# Patient Record
Sex: Female | Born: 1945
Health system: Southern US, Community
[De-identification: ages and names within clinical notes are randomized; demographics above are authoritative.]

## PROBLEM LIST (undated history)

## (undated) DIAGNOSIS — E785 Hyperlipidemia, unspecified: Secondary | ICD-10-CM

## (undated) DIAGNOSIS — I700 Atherosclerosis of aorta: Secondary | ICD-10-CM

## (undated) DIAGNOSIS — I10 Essential (primary) hypertension: Secondary | ICD-10-CM

## (undated) DIAGNOSIS — M5432 Sciatica, left side: Secondary | ICD-10-CM

## (undated) DIAGNOSIS — M1990 Unspecified osteoarthritis, unspecified site: Secondary | ICD-10-CM

## (undated) DIAGNOSIS — R413 Other amnesia: Secondary | ICD-10-CM

## (undated) DIAGNOSIS — M109 Gout, unspecified: Secondary | ICD-10-CM

## (undated) DIAGNOSIS — L409 Psoriasis, unspecified: Secondary | ICD-10-CM

## (undated) DIAGNOSIS — N183 Chronic kidney disease, stage 3 unspecified: Secondary | ICD-10-CM

## (undated) HISTORY — DX: Chronic kidney disease, stage 3 unspecified: N18.30

## (undated) HISTORY — DX: Hyperlipidemia, unspecified: E78.5

## (undated) HISTORY — DX: Other amnesia: R41.3

## (undated) HISTORY — DX: Sciatica, left side: M54.32

## (undated) HISTORY — DX: Gout, unspecified: M10.9

## (undated) HISTORY — DX: Unspecified osteoarthritis, unspecified site: M19.90

## (undated) HISTORY — DX: Psoriasis, unspecified: L40.9

## (undated) HISTORY — PX: APPENDECTOMY: SHX54

## (undated) HISTORY — DX: Essential (primary) hypertension: I10

## (undated) HISTORY — DX: Atherosclerosis of aorta: I70.0

---

## 2015-04-09 ENCOUNTER — Other Ambulatory Visit: Payer: Self-pay

## 2015-04-09 DIAGNOSIS — Z1231 Encounter for screening mammogram for malignant neoplasm of breast: Secondary | ICD-10-CM

## 2015-04-16 DIAGNOSIS — Z79899 Other long term (current) drug therapy: Secondary | ICD-10-CM | POA: Diagnosis not present

## 2015-04-16 DIAGNOSIS — E559 Vitamin D deficiency, unspecified: Secondary | ICD-10-CM | POA: Diagnosis not present

## 2015-04-16 DIAGNOSIS — M8580 Other specified disorders of bone density and structure, unspecified site: Secondary | ICD-10-CM | POA: Diagnosis not present

## 2015-04-16 DIAGNOSIS — Z7982 Long term (current) use of aspirin: Secondary | ICD-10-CM | POA: Diagnosis not present

## 2015-04-16 DIAGNOSIS — I10 Essential (primary) hypertension: Secondary | ICD-10-CM | POA: Diagnosis not present

## 2015-04-18 ENCOUNTER — Ambulatory Visit: Payer: Self-pay

## 2015-04-30 DIAGNOSIS — Z1211 Encounter for screening for malignant neoplasm of colon: Secondary | ICD-10-CM | POA: Diagnosis not present

## 2015-04-30 DIAGNOSIS — Z1212 Encounter for screening for malignant neoplasm of rectum: Secondary | ICD-10-CM | POA: Diagnosis not present

## 2015-05-01 DIAGNOSIS — H20023 Recurrent acute iridocyclitis, bilateral: Secondary | ICD-10-CM | POA: Diagnosis not present

## 2015-05-01 DIAGNOSIS — H40052 Ocular hypertension, left eye: Secondary | ICD-10-CM | POA: Diagnosis not present

## 2015-05-10 DIAGNOSIS — E785 Hyperlipidemia, unspecified: Secondary | ICD-10-CM | POA: Diagnosis not present

## 2015-05-10 DIAGNOSIS — I10 Essential (primary) hypertension: Secondary | ICD-10-CM | POA: Diagnosis not present

## 2015-05-22 DIAGNOSIS — I7090 Unspecified atherosclerosis: Secondary | ICD-10-CM | POA: Diagnosis not present

## 2015-05-22 DIAGNOSIS — I10 Essential (primary) hypertension: Secondary | ICD-10-CM | POA: Diagnosis not present

## 2015-05-22 DIAGNOSIS — E559 Vitamin D deficiency, unspecified: Secondary | ICD-10-CM | POA: Diagnosis not present

## 2015-06-04 DIAGNOSIS — H20023 Recurrent acute iridocyclitis, bilateral: Secondary | ICD-10-CM | POA: Diagnosis not present

## 2015-06-04 DIAGNOSIS — H40052 Ocular hypertension, left eye: Secondary | ICD-10-CM | POA: Diagnosis not present

## 2015-06-25 ENCOUNTER — Ambulatory Visit: Payer: Self-pay

## 2015-06-25 DIAGNOSIS — I10 Essential (primary) hypertension: Secondary | ICD-10-CM | POA: Diagnosis not present

## 2015-07-26 DIAGNOSIS — I10 Essential (primary) hypertension: Secondary | ICD-10-CM | POA: Diagnosis not present

## 2015-08-20 ENCOUNTER — Ambulatory Visit (INDEPENDENT_AMBULATORY_CARE_PROVIDER_SITE_OTHER): Payer: Medicare Other | Admitting: Family Medicine

## 2015-08-20 VITALS — BP 132/74 | HR 69 | Temp 98.1°F | Resp 18 | Ht 63.0 in | Wt 162.0 lb

## 2015-08-20 DIAGNOSIS — H25813 Combined forms of age-related cataract, bilateral: Secondary | ICD-10-CM | POA: Diagnosis not present

## 2015-08-20 DIAGNOSIS — L03113 Cellulitis of right upper limb: Secondary | ICD-10-CM

## 2015-08-20 DIAGNOSIS — L237 Allergic contact dermatitis due to plants, except food: Secondary | ICD-10-CM | POA: Diagnosis not present

## 2015-08-20 DIAGNOSIS — H40052 Ocular hypertension, left eye: Secondary | ICD-10-CM | POA: Diagnosis not present

## 2015-08-20 DIAGNOSIS — H2013 Chronic iridocyclitis, bilateral: Secondary | ICD-10-CM | POA: Diagnosis not present

## 2015-08-20 MED ORDER — PREDNISONE 20 MG PO TABS: ORAL_TABLET | ORAL | Status: DC

## 2015-08-20 MED ORDER — MUPIROCIN CALCIUM 2 % EX CREA: 1.0000 "application " | TOPICAL_CREAM | Freq: Three times a day (TID) | CUTANEOUS | Status: DC

## 2015-08-20 NOTE — Patient Instructions (Addendum)
IF you received an x-ray today, you will receive an invoice from [facility] Radiology. Please contact [facility] Radiology at [phone] with questions or concerns regarding your invoice.   IF you received labwork today, you will receive an invoice from [employer]. Please contact [facility] at [phone] with questions or concerns regarding your invoice.   Our billing staff will not be able to assist you with questions regarding bills from these companies.  You will be contacted with the lab results as soon as they are available. The fastest way to get your results is to activate your My Chart account. Instructions are located on the last page of this paperwork. If you have not heard from [place] regarding the results in 2 weeks, please contact this office.     Your rash does appear to be due to poison ivy, and based on locations of spread, will start prednisone. Okay to use the hydrocortisone for itching areas as needed for the next day or 2, as well as antihistamine to help with itching.  The redness on the right arm appears to be a possible secondary infection, but as it is improving, will start with antibiotic ointment 3 times per day. If you notice spread of the redness, or worsening, return for recheck as you may need to be on oral antibiotics.  Return to the clinic or go to the nearest emergency room if any of your symptoms worsen or new symptoms occur.  Cellulitis Cellulitis is an infection of the skin and the tissue beneath it. The infected area is usually red and tender. Cellulitis occurs most often in the arms and lower legs.  CAUSES  Cellulitis is caused by bacteria that enter the skin through cracks or cuts in the skin. The most common types of bacteria that cause cellulitis are staphylococci and streptococci. SIGNS AND SYMPTOMS   Redness and warmth.  Swelling.  Tenderness or pain.  Fever. DIAGNOSIS  Your health care provider can usually  determine what is wrong based on a physical exam. Blood tests may also be done. TREATMENT  Treatment usually involves taking an antibiotic medicine. HOME CARE INSTRUCTIONS   Take your antibiotic medicine as directed by your health care provider. Finish the antibiotic even if you start to feel better.  Keep the infected arm or leg elevated to reduce swelling.  Apply a warm cloth to the affected area up to 4 times per day to relieve pain.  Take medicines only as directed by your health care provider.  Keep all follow-up visits as directed by your health care provider. SEEK MEDICAL CARE IF:   You notice red streaks coming from the infected area.  Your red area gets larger or turns dark in color.  Your bone or joint underneath the infected area becomes painful after the skin has healed.  Your infection returns in the same area or another area.  You notice a swollen bump in the infected area.  You develop new symptoms.  You have a fever. SEEK IMMEDIATE MEDICAL CARE IF:   You feel very sleepy.  You develop vomiting or diarrhea.  You have a general ill feeling (malaise) with muscle aches and pains.   This information is not intended to replace advice given to you by your health care provider. Make sure you discuss any questions you have with your health care provider.   Document Released: 11/06/2004 Document Revised: 10/18/2014 Document Reviewed: 04/14/2011 Elsevier Interactive Patient Education 2016 [place] ivy is  a inflammation of the skin (contact dermatitis) caused by touching the allergens on the leaves of the ivy plant following previous exposure to the plant. The rash usually appears 48 hours after exposure. The rash is usually bumps (papules) or blisters (vesicles) in a linear pattern. Depending on your own sensitivity, the rash may simply cause redness and itching, or it may also progress to blisters which may break open. These must be well cared  for to prevent secondary bacterial (germ) infection, followed by scarring. Keep any open areas dry, clean, dressed, and covered with an antibacterial ointment if needed. The eyes may also get puffy. The puffiness is worst in the morning and gets better as the day progresses. This dermatitis usually heals without scarring, within 2 to 3 weeks without treatment. HOME CARE INSTRUCTIONS  Thoroughly wash with soap and water as soon as you have been exposed to poison ivy. You have about one half hour to remove the plant resin before it will cause the rash. This washing will destroy the oil or antigen on the skin that is causing, or will cause, the rash. Be sure to wash under your fingernails as any plant resin there will continue to spread the rash. Do not rub skin vigorously when washing affected area. Poison ivy cannot spread if no oil from the plant remains on your body. A rash that has progressed to weeping sores will not spread the rash unless you have not washed thoroughly. It is also important to wash any clothes you have been wearing as these may carry active allergens. The rash will return if you wear the unwashed clothing, even several days later. Avoidance of the plant in the future is the best measure. Poison ivy plant can be recognized by the number of leaves. Generally, poison ivy has three leaves with flowering branches on a single stem. Diphenhydramine may be purchased over the counter and used as needed for itching. Do not drive with this medication if it makes you drowsy.Ask your caregiver about medication for children. SEEK MEDICAL CARE IF:  Open sores develop.  Redness spreads beyond area of rash.  You notice purulent (pus-like) discharge.  You have increased pain.  Other signs of infection develop (such as fever).   This information is not intended to replace advice given to you by your health care provider. Make sure you discuss any questions you have with your health care  provider.   Document Released: 01/25/2000 Document Revised: 04/21/2011 Document Reviewed: 07/05/2014 Elsevier Interactive Patient Education [employer].

## 2015-08-20 NOTE — Progress Notes (Signed)
Subjective:  By signing my name below, I, Moises Blood, attest that this documentation has been prepared under the direction and in the presence of Merri Ray, MD. Electronically Signed: Moises Blood, [place]. 08/20/2015 , 2:32 PM .  Patient was seen in Room 10 .   Patient ID: Jessica Wallace, female    DOB: March 19, 1945, 70 y.o.   MRN: [id number] Chief Complaint  Patient presents with  . Rash    x 10 days, whole body, possible poison ivy   HPI Jessica Wallace is a 70 y.o. female  Patient complains of a rash located all over her body, ongoing for about 2 weeks now. She states she was pulling weeds in the yard, and believes she came into contact with poison ivy. She initially noticed a rash over her right arm and a little over her left upper face and left ear. She informs her rash over her face has improved and now dried; however, the rash over her right arm worsened 1 week ago with burning, itchiness and slight swelling. She's started to notice the rash spreading to other locations over her body, including her chest, left arm, behind her left leg and some patches over her right lower leg. She's tried an antibiotic cream a few times, as well as [facility] OTC hydrocortisone cream 1% and benadryl allergy pill qd. She denies any side effects with the OTC medication. She denies any lesions in her mouth or genitalia. She denies fever or chills.   She mentions she took indomethacin for gout "a while ago", and caused her to have nausea, vomiting and diarrhea.   There are no active problems to display for this patient.  Past Medical History  Diagnosis Date  . Hypertension    Past Surgical History  Procedure Laterality Date  . Appendectomy     No Known Allergies Prior to Admission medications   Not on File   Social History   Social History  . Marital Status: Divorced    Spouse Name: N/A  . Number of Children: N/A  . Years of Education: N/A   Occupational History  . Not on file.     Social History Main Topics  . Smoking status: Never Smoker   . Smokeless tobacco: Not on file  . Alcohol Use: Not on file  . Drug Use: Not on file  . Sexual Activity: Not on file   Other Topics Concern  . Not on file   Social History Narrative  . No narrative on file   Review of Systems  Constitutional: Negative for fever, chills and fatigue.  HENT: Negative for facial swelling and mouth sores.   Gastrointestinal: Negative for nausea, vomiting, abdominal pain and diarrhea.  Genitourinary: Negative for dysuria and genital sores.  Skin: Positive for rash. Negative for wound.       Objective:   Physical Exam  Constitutional: She is oriented to person, place, and time. She appears well-developed and well-nourished. No distress.  HENT:  Head: Normocephalic and atraumatic.  Mouth/Throat: Mucous membranes are normal. No oral lesions.  Eyes: EOM are normal. Pupils are equal, round, and reactive to light.  Neck: Neck supple.  Cardiovascular: Normal rate.   Pulmonary/Chest: Effort normal. No respiratory distress.  Musculoskeletal: Normal range of motion.  Lymphadenopathy:  No axillary nodes  Neurological: She is alert and oriented to person, place, and time.  Skin: Skin is warm and dry.  Multiple scattered patches of erythema and underlying vesicles with some linear appearance; mostly on the right dorsal  arm, left arm and chest all similar appearing patches, some faint crusting erythema left ear and left face at temporal area, 2 small interdigital lesions, similar patches behind left knee, left lower leg, few small patches in right lower leg  Right forearm: super imposed erythema and induration, erythema measured about 13x15cm with few excoriated areas  Psychiatric: She has a normal mood and affect. Her behavior is normal.  Nursing note and vitals reviewed.   Filed Vitals:   08/20/15 1408  BP: 132/74  Pulse: 69  Temp: 98.1 F (36.7 C)  Resp: 18  Height: 5\' 3"  (1.6 m)   Weight: 162 lb (73.483 kg)  SpO2: 97%      Assessment & Plan:   Jessica Wallace is a 70 y.o. female Poison ivy dermatitis - Plan: predniSONE (DELTASONE) 20 MG tablet  Cellulitis of arm, right - Plan: mupirocin cream (BACTROBAN) 2 %  Poison ivy dermatitis likely with diffuse spread, including face, but this appears to be improving. Area on right arm may have been secondary cellulitis, but by her report has had fading of erythema.   -Will cover with Bactroban topical 3 times a day, prednisone for systemic symptoms, and RTC precautions if any spread of erythema on arm or other systemic symptoms.   -Side effects discussed of prednisone and RTC precautions discussed.  Meds ordered this encounter  Medications  . predniSONE (DELTASONE) 20 MG tablet    Sig: 3 by mouth for 3 days, then 2 by mouth for 2 days, then 1 by mouth for 2 days, then 1/2 by mouth for 2 days.    Dispense:  16 tablet    Refill:  0  . mupirocin cream (BACTROBAN) 2 %    Sig: Apply 1 application topically 3 (three) times daily.    Dispense:  15 g    Refill:  0   Patient Instructions       IF you received an x-ray today, you will receive an invoice from [facility] Radiology. Please contact [facility] Radiology at [phone] with questions or concerns regarding your invoice.   IF you received labwork today, you will receive an invoice from [employer]. Please contact [facility] at [phone] with questions or concerns regarding your invoice.   Our billing staff will not be able to assist you with questions regarding bills from these companies.  You will be contacted with the lab results as soon as they are available. The fastest way to get your results is to activate your My Chart account. Instructions are located on the last page of this paperwork. If you have not heard from [place] regarding the results in 2 weeks, please contact this office.     Your rash does appear to be due to poison  ivy, and based on locations of spread, will start prednisone. Okay to use the hydrocortisone for itching areas as needed for the next day or 2, as well as antihistamine to help with itching.  The redness on the right arm appears to be a possible secondary infection, but as it is improving, will start with antibiotic ointment 3 times per day. If you notice spread of the redness, or worsening, return for recheck as you may need to be on oral antibiotics.  Return to the clinic or go to the nearest emergency room if any of your symptoms worsen or new symptoms occur.  Cellulitis Cellulitis is an infection of the skin and the tissue beneath it. The infected area is usually red and tender. Cellulitis  occurs most often in the arms and lower legs.  CAUSES  Cellulitis is caused by bacteria that enter the skin through cracks or cuts in the skin. The most common types of bacteria that cause cellulitis are staphylococci and streptococci. SIGNS AND SYMPTOMS   Redness and warmth.  Swelling.  Tenderness or pain.  Fever. DIAGNOSIS  Your health care provider can usually determine what is wrong based on a physical exam. Blood tests may also be done. TREATMENT  Treatment usually involves taking an antibiotic medicine. HOME CARE INSTRUCTIONS   Take your antibiotic medicine as directed by your health care provider. Finish the antibiotic even if you start to feel better.  Keep the infected arm or leg elevated to reduce swelling.  Apply a warm cloth to the affected area up to 4 times per day to relieve pain.  Take medicines only as directed by your health care provider.  Keep all follow-up visits as directed by your health care provider. SEEK MEDICAL CARE IF:   You notice red streaks coming from the infected area.  Your red area gets larger or turns dark in color.  Your bone or joint underneath the infected area becomes painful after the skin has healed.  Your infection returns in the same area or  another area.  You notice a swollen bump in the infected area.  You develop new symptoms.  You have a fever. SEEK IMMEDIATE MEDICAL CARE IF:   You feel very sleepy.  You develop vomiting or diarrhea.  You have a general ill feeling (malaise) with muscle aches and pains.   This information is not intended to replace advice given to you by your health care provider. Make sure you discuss any questions you have with your health care provider.   Document Released: 11/06/2004 Document Revised: 10/18/2014 Document Reviewed: 04/14/2011 Elsevier Interactive Patient Education 2016 [place] ivy is a inflammation of the skin (contact dermatitis) caused by touching the allergens on the leaves of the ivy plant following previous exposure to the plant. The rash usually appears 48 hours after exposure. The rash is usually bumps (papules) or blisters (vesicles) in a linear pattern. Depending on your own sensitivity, the rash may simply cause redness and itching, or it may also progress to blisters which may break open. These must be well cared for to prevent secondary bacterial (germ) infection, followed by scarring. Keep any open areas dry, clean, dressed, and covered with an antibacterial ointment if needed. The eyes may also get puffy. The puffiness is worst in the morning and gets better as the day progresses. This dermatitis usually heals without scarring, within 2 to 3 weeks without treatment. HOME CARE INSTRUCTIONS  Thoroughly wash with soap and water as soon as you have been exposed to poison ivy. You have about one half hour to remove the plant resin before it will cause the rash. This washing will destroy the oil or antigen on the skin that is causing, or will cause, the rash. Be sure to wash under your fingernails as any plant resin there will continue to spread the rash. Do not rub skin vigorously when washing affected area. Poison ivy cannot spread if no oil from the plant  remains on your body. A rash that has progressed to weeping sores will not spread the rash unless you have not washed thoroughly. It is also important to wash any clothes you have been wearing as these may carry active allergens. The rash will return if you wear  the unwashed clothing, even several days later. Avoidance of the plant in the future is the best measure. Poison ivy plant can be recognized by the number of leaves. Generally, poison ivy has three leaves with flowering branches on a single stem. Diphenhydramine may be purchased over the counter and used as needed for itching. Do not drive with this medication if it makes you drowsy.Ask your caregiver about medication for children. SEEK MEDICAL CARE IF:  Open sores develop.  Redness spreads beyond area of rash.  You notice purulent (pus-like) discharge.  You have increased pain.  Other signs of infection develop (such as fever).   This information is not intended to replace advice given to you by your health care provider. Make sure you discuss any questions you have with your health care provider.   Document Released: 01/25/2000 Document Revised: 04/21/2011 Document Reviewed: 07/05/2014 Elsevier Interactive Patient Education [employer].     I personally performed the services described in this documentation, which was scribed in my presence. The recorded information has been reviewed and considered, and addended by me as needed.   Signed,   Merri Ray, MD [facility].  08/20/2015 2:37 PM

## 2015-08-27 ENCOUNTER — Ambulatory Visit
Admission: RE | Admit: 2015-08-27 | Discharge: 2015-08-27 | Disposition: A | Discharge status: Home / Self Care | Payer: Medicare Other | Source: Ambulatory Visit

## 2015-08-27 DIAGNOSIS — Z1231 Encounter for screening mammogram for malignant neoplasm of breast: Secondary | ICD-10-CM | POA: Diagnosis not present

## 2015-11-19 DIAGNOSIS — H40052 Ocular hypertension, left eye: Secondary | ICD-10-CM | POA: Diagnosis not present

## 2015-11-19 DIAGNOSIS — H25813 Combined forms of age-related cataract, bilateral: Secondary | ICD-10-CM | POA: Diagnosis not present

## 2015-11-19 DIAGNOSIS — H2013 Chronic iridocyclitis, bilateral: Secondary | ICD-10-CM | POA: Diagnosis not present

## 2015-11-23 DIAGNOSIS — H25813 Combined forms of age-related cataract, bilateral: Secondary | ICD-10-CM | POA: Diagnosis not present

## 2015-11-23 DIAGNOSIS — H40052 Ocular hypertension, left eye: Secondary | ICD-10-CM | POA: Diagnosis not present

## 2015-11-23 DIAGNOSIS — H2013 Chronic iridocyclitis, bilateral: Secondary | ICD-10-CM | POA: Diagnosis not present

## 2015-12-18 DIAGNOSIS — H2013 Chronic iridocyclitis, bilateral: Secondary | ICD-10-CM | POA: Diagnosis not present

## 2015-12-18 DIAGNOSIS — H40052 Ocular hypertension, left eye: Secondary | ICD-10-CM | POA: Diagnosis not present

## 2015-12-18 DIAGNOSIS — H25813 Combined forms of age-related cataract, bilateral: Secondary | ICD-10-CM | POA: Diagnosis not present

## 2016-03-11 DIAGNOSIS — H2013 Chronic iridocyclitis, bilateral: Secondary | ICD-10-CM | POA: Diagnosis not present

## 2016-03-11 DIAGNOSIS — H25813 Combined forms of age-related cataract, bilateral: Secondary | ICD-10-CM | POA: Diagnosis not present

## 2016-03-11 DIAGNOSIS — H40052 Ocular hypertension, left eye: Secondary | ICD-10-CM | POA: Diagnosis not present

## 2016-04-17 DIAGNOSIS — E785 Hyperlipidemia, unspecified: Secondary | ICD-10-CM | POA: Diagnosis not present

## 2016-04-17 DIAGNOSIS — M8580 Other specified disorders of bone density and structure, unspecified site: Secondary | ICD-10-CM | POA: Diagnosis not present

## 2016-04-17 DIAGNOSIS — E782 Mixed hyperlipidemia: Secondary | ICD-10-CM | POA: Diagnosis not present

## 2016-04-17 DIAGNOSIS — I10 Essential (primary) hypertension: Secondary | ICD-10-CM | POA: Diagnosis not present

## 2016-04-17 DIAGNOSIS — E559 Vitamin D deficiency, unspecified: Secondary | ICD-10-CM | POA: Diagnosis not present

## 2016-04-17 DIAGNOSIS — Z0000 Encounter for general adult medical examination without abnormal findings: Secondary | ICD-10-CM | POA: Diagnosis not present

## 2016-04-18 DIAGNOSIS — N390 Urinary tract infection, site not specified: Secondary | ICD-10-CM | POA: Diagnosis not present

## 2016-04-22 DIAGNOSIS — Z0001 Encounter for general adult medical examination with abnormal findings: Secondary | ICD-10-CM | POA: Diagnosis not present

## 2016-04-22 DIAGNOSIS — R748 Abnormal levels of other serum enzymes: Secondary | ICD-10-CM | POA: Diagnosis not present

## 2016-04-22 DIAGNOSIS — Z1212 Encounter for screening for malignant neoplasm of rectum: Secondary | ICD-10-CM | POA: Diagnosis not present

## 2016-05-27 DIAGNOSIS — H43813 Vitreous degeneration, bilateral: Secondary | ICD-10-CM | POA: Diagnosis not present

## 2016-05-27 DIAGNOSIS — H25813 Combined forms of age-related cataract, bilateral: Secondary | ICD-10-CM | POA: Diagnosis not present

## 2016-05-27 DIAGNOSIS — H527 Unspecified disorder of refraction: Secondary | ICD-10-CM | POA: Diagnosis not present

## 2016-05-27 DIAGNOSIS — H2013 Chronic iridocyclitis, bilateral: Secondary | ICD-10-CM | POA: Diagnosis not present

## 2016-05-27 DIAGNOSIS — H40052 Ocular hypertension, left eye: Secondary | ICD-10-CM | POA: Diagnosis not present

## 2016-09-03 DIAGNOSIS — H2013 Chronic iridocyclitis, bilateral: Secondary | ICD-10-CM | POA: Diagnosis not present

## 2016-10-08 DIAGNOSIS — H40052 Ocular hypertension, left eye: Secondary | ICD-10-CM | POA: Diagnosis not present

## 2016-10-08 DIAGNOSIS — H21542 Posterior synechiae (iris), left eye: Secondary | ICD-10-CM | POA: Diagnosis not present

## 2016-10-08 DIAGNOSIS — H20052 Hypopyon, left eye: Secondary | ICD-10-CM | POA: Diagnosis not present

## 2016-10-08 DIAGNOSIS — H2013 Chronic iridocyclitis, bilateral: Secondary | ICD-10-CM | POA: Diagnosis not present

## 2016-10-15 DIAGNOSIS — H2013 Chronic iridocyclitis, bilateral: Secondary | ICD-10-CM | POA: Diagnosis not present

## 2016-10-15 DIAGNOSIS — H40052 Ocular hypertension, left eye: Secondary | ICD-10-CM | POA: Diagnosis not present

## 2016-10-15 DIAGNOSIS — H21542 Posterior synechiae (iris), left eye: Secondary | ICD-10-CM | POA: Diagnosis not present

## 2016-10-15 DIAGNOSIS — H20052 Hypopyon, left eye: Secondary | ICD-10-CM | POA: Diagnosis not present

## 2016-10-23 DIAGNOSIS — I10 Essential (primary) hypertension: Secondary | ICD-10-CM | POA: Diagnosis not present

## 2016-10-23 DIAGNOSIS — M8580 Other specified disorders of bone density and structure, unspecified site: Secondary | ICD-10-CM | POA: Diagnosis not present

## 2016-10-23 DIAGNOSIS — M859 Disorder of bone density and structure, unspecified: Secondary | ICD-10-CM | POA: Diagnosis not present

## 2016-10-24 DIAGNOSIS — H21542 Posterior synechiae (iris), left eye: Secondary | ICD-10-CM | POA: Diagnosis not present

## 2016-10-24 DIAGNOSIS — H20052 Hypopyon, left eye: Secondary | ICD-10-CM | POA: Diagnosis not present

## 2016-10-24 DIAGNOSIS — H2013 Chronic iridocyclitis, bilateral: Secondary | ICD-10-CM | POA: Diagnosis not present

## 2016-10-24 DIAGNOSIS — H35352 Cystoid macular degeneration, left eye: Secondary | ICD-10-CM | POA: Diagnosis not present

## 2016-11-04 DIAGNOSIS — Z1159 Encounter for screening for other viral diseases: Secondary | ICD-10-CM | POA: Diagnosis not present

## 2016-11-04 DIAGNOSIS — H2513 Age-related nuclear cataract, bilateral: Secondary | ICD-10-CM | POA: Insufficient documentation

## 2016-11-04 DIAGNOSIS — H35372 Puckering of macula, left eye: Secondary | ICD-10-CM | POA: Diagnosis not present

## 2016-11-04 DIAGNOSIS — H35033 Hypertensive retinopathy, bilateral: Secondary | ICD-10-CM | POA: Diagnosis not present

## 2016-11-04 DIAGNOSIS — Z7982 Long term (current) use of aspirin: Secondary | ICD-10-CM | POA: Diagnosis not present

## 2016-11-04 DIAGNOSIS — Z79899 Other long term (current) drug therapy: Secondary | ICD-10-CM | POA: Diagnosis not present

## 2016-11-04 DIAGNOSIS — I10 Essential (primary) hypertension: Secondary | ICD-10-CM | POA: Diagnosis not present

## 2016-11-04 DIAGNOSIS — H30032 Focal chorioretinal inflammation, peripheral, left eye: Secondary | ICD-10-CM | POA: Diagnosis not present

## 2016-11-25 DIAGNOSIS — H35033 Hypertensive retinopathy, bilateral: Secondary | ICD-10-CM | POA: Diagnosis not present

## 2016-11-25 DIAGNOSIS — H2513 Age-related nuclear cataract, bilateral: Secondary | ICD-10-CM | POA: Diagnosis not present

## 2016-11-25 DIAGNOSIS — H35372 Puckering of macula, left eye: Secondary | ICD-10-CM | POA: Diagnosis not present

## 2016-11-25 DIAGNOSIS — H30032 Focal chorioretinal inflammation, peripheral, left eye: Secondary | ICD-10-CM | POA: Diagnosis not present

## 2016-12-23 DIAGNOSIS — H2513 Age-related nuclear cataract, bilateral: Secondary | ICD-10-CM | POA: Diagnosis not present

## 2016-12-23 DIAGNOSIS — H35362 Drusen (degenerative) of macula, left eye: Secondary | ICD-10-CM | POA: Diagnosis not present

## 2016-12-23 DIAGNOSIS — Z79899 Other long term (current) drug therapy: Secondary | ICD-10-CM | POA: Diagnosis not present

## 2016-12-23 DIAGNOSIS — H35033 Hypertensive retinopathy, bilateral: Secondary | ICD-10-CM | POA: Diagnosis not present

## 2016-12-23 DIAGNOSIS — H35372 Puckering of macula, left eye: Secondary | ICD-10-CM | POA: Diagnosis not present

## 2016-12-23 DIAGNOSIS — H30032 Focal chorioretinal inflammation, peripheral, left eye: Secondary | ICD-10-CM | POA: Diagnosis not present

## 2017-02-17 DIAGNOSIS — Z79899 Other long term (current) drug therapy: Secondary | ICD-10-CM | POA: Diagnosis not present

## 2017-02-17 DIAGNOSIS — Z5181 Encounter for therapeutic drug level monitoring: Secondary | ICD-10-CM | POA: Diagnosis not present

## 2017-02-17 DIAGNOSIS — H35372 Puckering of macula, left eye: Secondary | ICD-10-CM | POA: Diagnosis not present

## 2017-02-17 DIAGNOSIS — H30032 Focal chorioretinal inflammation, peripheral, left eye: Secondary | ICD-10-CM | POA: Diagnosis not present

## 2017-02-17 DIAGNOSIS — H35033 Hypertensive retinopathy, bilateral: Secondary | ICD-10-CM | POA: Diagnosis not present

## 2017-02-17 DIAGNOSIS — H2513 Age-related nuclear cataract, bilateral: Secondary | ICD-10-CM | POA: Diagnosis not present

## 2017-04-07 DIAGNOSIS — H2513 Age-related nuclear cataract, bilateral: Secondary | ICD-10-CM | POA: Diagnosis not present

## 2017-04-07 DIAGNOSIS — Z79899 Other long term (current) drug therapy: Secondary | ICD-10-CM | POA: Diagnosis not present

## 2017-04-07 DIAGNOSIS — Z5181 Encounter for therapeutic drug level monitoring: Secondary | ICD-10-CM | POA: Diagnosis not present

## 2017-04-07 DIAGNOSIS — H35372 Puckering of macula, left eye: Secondary | ICD-10-CM | POA: Diagnosis not present

## 2017-04-07 DIAGNOSIS — H35033 Hypertensive retinopathy, bilateral: Secondary | ICD-10-CM | POA: Diagnosis not present

## 2017-04-07 DIAGNOSIS — H30032 Focal chorioretinal inflammation, peripheral, left eye: Secondary | ICD-10-CM | POA: Diagnosis not present

## 2017-04-22 DIAGNOSIS — Z7982 Long term (current) use of aspirin: Secondary | ICD-10-CM | POA: Diagnosis not present

## 2017-04-22 DIAGNOSIS — I10 Essential (primary) hypertension: Secondary | ICD-10-CM | POA: Diagnosis not present

## 2017-04-22 DIAGNOSIS — E782 Mixed hyperlipidemia: Secondary | ICD-10-CM | POA: Diagnosis not present

## 2017-04-22 DIAGNOSIS — N390 Urinary tract infection, site not specified: Secondary | ICD-10-CM | POA: Diagnosis not present

## 2017-04-27 DIAGNOSIS — Z0001 Encounter for general adult medical examination with abnormal findings: Secondary | ICD-10-CM | POA: Diagnosis not present

## 2017-04-27 DIAGNOSIS — E782 Mixed hyperlipidemia: Secondary | ICD-10-CM | POA: Diagnosis not present

## 2017-04-27 DIAGNOSIS — R7303 Prediabetes: Secondary | ICD-10-CM | POA: Diagnosis not present

## 2017-04-27 DIAGNOSIS — M8580 Other specified disorders of bone density and structure, unspecified site: Secondary | ICD-10-CM | POA: Diagnosis not present

## 2017-04-27 DIAGNOSIS — I10 Essential (primary) hypertension: Secondary | ICD-10-CM | POA: Diagnosis not present

## 2017-06-12 DIAGNOSIS — H35372 Puckering of macula, left eye: Secondary | ICD-10-CM | POA: Diagnosis not present

## 2017-06-12 DIAGNOSIS — Z79899 Other long term (current) drug therapy: Secondary | ICD-10-CM | POA: Diagnosis not present

## 2017-06-12 DIAGNOSIS — H35033 Hypertensive retinopathy, bilateral: Secondary | ICD-10-CM | POA: Diagnosis not present

## 2017-06-12 DIAGNOSIS — H30032 Focal chorioretinal inflammation, peripheral, left eye: Secondary | ICD-10-CM | POA: Diagnosis not present

## 2017-06-12 DIAGNOSIS — H2513 Age-related nuclear cataract, bilateral: Secondary | ICD-10-CM | POA: Diagnosis not present

## 2017-07-21 DIAGNOSIS — H35372 Puckering of macula, left eye: Secondary | ICD-10-CM | POA: Diagnosis not present

## 2017-07-21 DIAGNOSIS — H35033 Hypertensive retinopathy, bilateral: Secondary | ICD-10-CM | POA: Diagnosis not present

## 2017-07-21 DIAGNOSIS — H2513 Age-related nuclear cataract, bilateral: Secondary | ICD-10-CM | POA: Diagnosis not present

## 2017-07-21 DIAGNOSIS — Z79899 Other long term (current) drug therapy: Secondary | ICD-10-CM | POA: Diagnosis not present

## 2017-07-21 DIAGNOSIS — H30032 Focal chorioretinal inflammation, peripheral, left eye: Secondary | ICD-10-CM | POA: Diagnosis not present

## 2017-07-22 DIAGNOSIS — H30032 Focal chorioretinal inflammation, peripheral, left eye: Secondary | ICD-10-CM | POA: Diagnosis not present

## 2017-07-22 DIAGNOSIS — Z79899 Other long term (current) drug therapy: Secondary | ICD-10-CM | POA: Diagnosis not present

## 2017-07-24 DIAGNOSIS — I10 Essential (primary) hypertension: Secondary | ICD-10-CM | POA: Diagnosis not present

## 2017-07-24 DIAGNOSIS — H209 Unspecified iridocyclitis: Secondary | ICD-10-CM | POA: Diagnosis not present

## 2017-07-29 ENCOUNTER — Other Ambulatory Visit: Payer: Self-pay

## 2017-07-29 NOTE — Patient Outreach (Signed)
[place] [facility]) Care Management  07/29/2017  Jiah Bari [birth year] [id number]   Medication Adherence call to Mrs. Malaiah Viramontes left a message for patient to call back patient is due on Telmisartan 80 mg .[place] said patient pick up today (07/29/17) from the pharmacy for a 30 days supply.Mrs. Garretson is showing past due under [place].   [place] Management Direct Dial [phone]  Fax [phone] [email or web address]

## 2017-09-11 DIAGNOSIS — H2513 Age-related nuclear cataract, bilateral: Secondary | ICD-10-CM | POA: Diagnosis not present

## 2017-09-11 DIAGNOSIS — H30033 Focal chorioretinal inflammation, peripheral, bilateral: Secondary | ICD-10-CM | POA: Diagnosis not present

## 2017-09-11 DIAGNOSIS — Z79899 Other long term (current) drug therapy: Secondary | ICD-10-CM | POA: Diagnosis not present

## 2017-09-11 DIAGNOSIS — H35033 Hypertensive retinopathy, bilateral: Secondary | ICD-10-CM | POA: Diagnosis not present

## 2017-09-11 DIAGNOSIS — H35372 Puckering of macula, left eye: Secondary | ICD-10-CM | POA: Diagnosis not present

## 2017-11-11 ENCOUNTER — Other Ambulatory Visit: Payer: Self-pay

## 2017-11-11 NOTE — Patient Outreach (Signed)
[place] [facility]) Care Management  11/11/2017  Jessica Wallace 05/06/1945 [id number]   Medication Adherence call to Jessica Wallace spoke with patient she already order both medication and will pick up today patient is due on Atorvastatin 20 mg and Telmisartan 80 mg. Jessica Wallace is showing past due under [place].   [place] Management Direct Dial [phone]  Fax [phone] [email or web address]

## 2017-11-17 DIAGNOSIS — H35372 Puckering of macula, left eye: Secondary | ICD-10-CM | POA: Diagnosis not present

## 2017-11-17 DIAGNOSIS — H35033 Hypertensive retinopathy, bilateral: Secondary | ICD-10-CM | POA: Diagnosis not present

## 2017-11-17 DIAGNOSIS — Z79899 Other long term (current) drug therapy: Secondary | ICD-10-CM | POA: Diagnosis not present

## 2017-11-17 DIAGNOSIS — H2513 Age-related nuclear cataract, bilateral: Secondary | ICD-10-CM | POA: Diagnosis not present

## 2017-11-17 DIAGNOSIS — H30033 Focal chorioretinal inflammation, peripheral, bilateral: Secondary | ICD-10-CM | POA: Diagnosis not present

## 2017-11-19 DIAGNOSIS — D165 Benign neoplasm of lower jaw bone: Secondary | ICD-10-CM | POA: Diagnosis not present

## 2017-12-15 DIAGNOSIS — K045 Chronic apical periodontitis: Secondary | ICD-10-CM | POA: Diagnosis not present

## 2017-12-18 DIAGNOSIS — K045 Chronic apical periodontitis: Secondary | ICD-10-CM | POA: Diagnosis not present

## 2018-01-26 ENCOUNTER — Other Ambulatory Visit: Payer: Self-pay

## 2018-01-26 DIAGNOSIS — H30033 Focal chorioretinal inflammation, peripheral, bilateral: Secondary | ICD-10-CM | POA: Diagnosis not present

## 2018-01-26 DIAGNOSIS — H35033 Hypertensive retinopathy, bilateral: Secondary | ICD-10-CM | POA: Diagnosis not present

## 2018-01-26 DIAGNOSIS — Z79899 Other long term (current) drug therapy: Secondary | ICD-10-CM | POA: Diagnosis not present

## 2018-01-26 DIAGNOSIS — H35372 Puckering of macula, left eye: Secondary | ICD-10-CM | POA: Diagnosis not present

## 2018-01-26 DIAGNOSIS — H2513 Age-related nuclear cataract, bilateral: Secondary | ICD-10-CM | POA: Diagnosis not present

## 2018-01-26 NOTE — Patient Outreach (Signed)
[place] [facility]) Care Management  01/26/2018  Kennadee Walthour [birth year] [id number]   Medication Adherence call to Mr. Hilda Blades patient did not answer patient is due on Telmisartan 80 mg. Mrs. Threat is showing past due under [place].   [place] Management Direct Dial [phone]  Fax [phone] [email or web address]

## 2018-02-01 ENCOUNTER — Other Ambulatory Visit: Payer: Self-pay

## 2018-02-01 NOTE — Patient Outreach (Signed)
[place] [facility]) Care Management  02/01/2018  Arcadia Gorgas September 23, 1945 [id number]   Medication Adherence call to Mrs. Kassadie Pancake spoke with patient she is due on Telmisartan 80 mg she pick this medication from [facility] today for a 90 days supply. Mrs. Ressel is showing past due under [place].   [place] Management Direct Dial [phone]  Fax [phone] [email or web address]

## 2018-03-08 DIAGNOSIS — H2513 Age-related nuclear cataract, bilateral: Secondary | ICD-10-CM | POA: Diagnosis not present

## 2018-03-08 DIAGNOSIS — H35372 Puckering of macula, left eye: Secondary | ICD-10-CM | POA: Diagnosis not present

## 2018-03-08 DIAGNOSIS — H30033 Focal chorioretinal inflammation, peripheral, bilateral: Secondary | ICD-10-CM | POA: Diagnosis not present

## 2018-03-08 DIAGNOSIS — H35033 Hypertensive retinopathy, bilateral: Secondary | ICD-10-CM | POA: Diagnosis not present

## 2018-03-30 DIAGNOSIS — Z01818 Encounter for other preprocedural examination: Secondary | ICD-10-CM | POA: Diagnosis not present

## 2018-03-30 DIAGNOSIS — H2513 Age-related nuclear cataract, bilateral: Secondary | ICD-10-CM | POA: Diagnosis not present

## 2018-04-08 DIAGNOSIS — I10 Essential (primary) hypertension: Secondary | ICD-10-CM | POA: Diagnosis not present

## 2018-04-08 DIAGNOSIS — H25812 Combined forms of age-related cataract, left eye: Secondary | ICD-10-CM | POA: Diagnosis not present

## 2018-04-13 DIAGNOSIS — Z961 Presence of intraocular lens: Secondary | ICD-10-CM | POA: Diagnosis not present

## 2018-04-13 DIAGNOSIS — H35033 Hypertensive retinopathy, bilateral: Secondary | ICD-10-CM | POA: Diagnosis not present

## 2018-04-13 DIAGNOSIS — Z9842 Cataract extraction status, left eye: Secondary | ICD-10-CM | POA: Diagnosis not present

## 2018-04-13 DIAGNOSIS — H30033 Focal chorioretinal inflammation, peripheral, bilateral: Secondary | ICD-10-CM | POA: Diagnosis not present

## 2018-04-13 DIAGNOSIS — H35372 Puckering of macula, left eye: Secondary | ICD-10-CM | POA: Diagnosis not present

## 2018-04-13 DIAGNOSIS — Z79899 Other long term (current) drug therapy: Secondary | ICD-10-CM | POA: Diagnosis not present

## 2018-04-22 DIAGNOSIS — I10 Essential (primary) hypertension: Secondary | ICD-10-CM | POA: Diagnosis not present

## 2018-04-22 DIAGNOSIS — Z9842 Cataract extraction status, left eye: Secondary | ICD-10-CM | POA: Diagnosis not present

## 2018-04-22 DIAGNOSIS — Z961 Presence of intraocular lens: Secondary | ICD-10-CM | POA: Diagnosis not present

## 2018-04-22 DIAGNOSIS — H25811 Combined forms of age-related cataract, right eye: Secondary | ICD-10-CM | POA: Diagnosis not present

## 2018-06-25 DIAGNOSIS — Z961 Presence of intraocular lens: Secondary | ICD-10-CM | POA: Diagnosis not present

## 2018-06-25 DIAGNOSIS — Z9889 Other specified postprocedural states: Secondary | ICD-10-CM | POA: Diagnosis not present

## 2018-06-25 DIAGNOSIS — Z79899 Other long term (current) drug therapy: Secondary | ICD-10-CM | POA: Diagnosis not present

## 2018-06-25 DIAGNOSIS — H35372 Puckering of macula, left eye: Secondary | ICD-10-CM | POA: Diagnosis not present

## 2018-06-25 DIAGNOSIS — H30033 Focal chorioretinal inflammation, peripheral, bilateral: Secondary | ICD-10-CM | POA: Diagnosis not present

## 2018-07-30 DIAGNOSIS — Z79899 Other long term (current) drug therapy: Secondary | ICD-10-CM | POA: Diagnosis not present

## 2018-07-30 DIAGNOSIS — Z9889 Other specified postprocedural states: Secondary | ICD-10-CM | POA: Diagnosis not present

## 2018-07-30 DIAGNOSIS — Z961 Presence of intraocular lens: Secondary | ICD-10-CM | POA: Diagnosis not present

## 2018-07-30 DIAGNOSIS — Z7952 Long term (current) use of systemic steroids: Secondary | ICD-10-CM | POA: Diagnosis not present

## 2018-07-30 DIAGNOSIS — H30033 Focal chorioretinal inflammation, peripheral, bilateral: Secondary | ICD-10-CM | POA: Diagnosis not present

## 2018-07-30 DIAGNOSIS — H35372 Puckering of macula, left eye: Secondary | ICD-10-CM | POA: Diagnosis not present

## 2018-08-05 ENCOUNTER — Other Ambulatory Visit: Payer: Self-pay

## 2018-08-05 NOTE — Patient Outreach (Signed)
[facility]) Care Management  08/05/2018  Jessica Wallace 1945/08/04 [id number]   Medication Adherence call to Mrs. Jessica Wallace [facility] [employer] [facility] spoke with patient she is past due on Atorvastatin 20 mg patient explain she is taking 1 tablet daily there is sometimes she miss a dose here and there but for the most part she takes her medications on a regular basis patient has plenty at this time and does not need any. [name]  Is showing past due under [place].   [place] Management Direct Dial [phone]  Fax [phone] [email or web address]

## 2018-09-24 DIAGNOSIS — Z961 Presence of intraocular lens: Secondary | ICD-10-CM | POA: Diagnosis not present

## 2018-09-24 DIAGNOSIS — H30033 Focal chorioretinal inflammation, peripheral, bilateral: Secondary | ICD-10-CM | POA: Diagnosis not present

## 2018-09-24 DIAGNOSIS — H4389 Other disorders of vitreous body: Secondary | ICD-10-CM | POA: Diagnosis not present

## 2018-10-11 ENCOUNTER — Other Ambulatory Visit: Payer: Self-pay

## 2018-10-11 NOTE — Patient Outreach (Signed)
[place] [facility]) Care Management  10/11/2018  Jessica Wallace 10/31/1945 [id number]   Medication Adherence call to Mrs. Jessica Wallace HIPPA Compliant Voice message left with a call back number. Jessica Wallace is showing past due on Telmisartan 80 mg under [place].  [place] Management Direct Dial [phone]  Fax [phone] [email or web address]

## 2018-10-19 ENCOUNTER — Other Ambulatory Visit: Payer: Self-pay

## 2018-10-19 NOTE — Patient Outreach (Signed)
[facility]) Care Management  10/19/2018  Jessica Wallace [birth year] [id number]   Medication Adherence call to Jessica Wallace Telephone call to Patient regarding Medication Adherence unable to reach patient. Jessica Wallace, Jessica Wallace is showing past due on Telmisartan 80 mg under [place].   [place] Management Direct Dial [phone]  Fax [phone] [email or web address]

## 2018-10-29 DIAGNOSIS — H35372 Puckering of macula, left eye: Secondary | ICD-10-CM | POA: Diagnosis not present

## 2018-10-29 DIAGNOSIS — Z79899 Other long term (current) drug therapy: Secondary | ICD-10-CM | POA: Diagnosis not present

## 2018-10-29 DIAGNOSIS — Z961 Presence of intraocular lens: Secondary | ICD-10-CM | POA: Diagnosis not present

## 2018-10-29 DIAGNOSIS — H30033 Focal chorioretinal inflammation, peripheral, bilateral: Secondary | ICD-10-CM | POA: Diagnosis not present

## 2018-11-15 ENCOUNTER — Other Ambulatory Visit: Payer: Self-pay

## 2018-11-15 NOTE — Patient Outreach (Signed)
[place] [facility]) Care Management  11/15/2018  Jessica Wallace 14-Feb-1945 [id number]   Medication Adherence call to Jessica Wallace HIPPA Compliant Voice message left with a call back number. Jessica Wallace is showing past due on Telmisartan 80 mg under [place].   [place] Management Direct Dial [phone]  Fax [phone] [email or web address]

## 2018-11-23 DIAGNOSIS — Z79899 Other long term (current) drug therapy: Secondary | ICD-10-CM | POA: Diagnosis not present

## 2018-11-23 DIAGNOSIS — H35372 Puckering of macula, left eye: Secondary | ICD-10-CM | POA: Diagnosis not present

## 2018-11-23 DIAGNOSIS — H30033 Focal chorioretinal inflammation, peripheral, bilateral: Secondary | ICD-10-CM | POA: Diagnosis not present

## 2018-11-23 DIAGNOSIS — Z961 Presence of intraocular lens: Secondary | ICD-10-CM | POA: Diagnosis not present

## 2019-01-12 DIAGNOSIS — H26493 Other secondary cataract, bilateral: Secondary | ICD-10-CM | POA: Diagnosis not present

## 2019-01-28 DIAGNOSIS — H30033 Focal chorioretinal inflammation, peripheral, bilateral: Secondary | ICD-10-CM | POA: Diagnosis not present

## 2019-01-28 DIAGNOSIS — H35372 Puckering of macula, left eye: Secondary | ICD-10-CM | POA: Diagnosis not present

## 2019-01-28 DIAGNOSIS — Z79899 Other long term (current) drug therapy: Secondary | ICD-10-CM | POA: Diagnosis not present

## 2019-01-28 DIAGNOSIS — Z961 Presence of intraocular lens: Secondary | ICD-10-CM | POA: Diagnosis not present

## 2019-03-04 DIAGNOSIS — H26491 Other secondary cataract, right eye: Secondary | ICD-10-CM | POA: Diagnosis not present

## 2019-03-21 ENCOUNTER — Ambulatory Visit: Payer: Medicare Other | Attending: Internal Medicine

## 2019-03-21 DIAGNOSIS — Z23 Encounter for immunization: Secondary | ICD-10-CM

## 2019-03-21 NOTE — Progress Notes (Signed)
   Covid-19 Vaccination Clinic  Name:  Jessica Wallace    MRN: [id number] DOB: 09-Oct-1945  03/21/2019  Jessica Wallace was observed post Covid-19 immunization for 15 minutes without incidence. She was provided with Vaccine Information Sheet and instruction to access the V-Safe system.   Jessica Wallace was instructed to call 911 with any severe reactions post vaccine: Marland Kitchen Difficulty breathing  . Swelling of your face and throat  . A fast heartbeat  . A bad rash all over your body  . Dizziness and weakness    Immunizations Administered    Name Date Dose VIS Date Route   Pfizer COVID-19 Vaccine 03/21/2019  6:10 PM 0.3 mL 01/21/2019 Intramuscular   Manufacturer: [place]   Lot: [id number]   [place]: [id number]

## 2019-03-29 DIAGNOSIS — H35372 Puckering of macula, left eye: Secondary | ICD-10-CM | POA: Diagnosis not present

## 2019-03-29 DIAGNOSIS — Z961 Presence of intraocular lens: Secondary | ICD-10-CM | POA: Diagnosis not present

## 2019-03-29 DIAGNOSIS — Z79899 Other long term (current) drug therapy: Secondary | ICD-10-CM | POA: Diagnosis not present

## 2019-03-29 DIAGNOSIS — H30033 Focal chorioretinal inflammation, peripheral, bilateral: Secondary | ICD-10-CM | POA: Diagnosis not present

## 2019-04-15 ENCOUNTER — Ambulatory Visit: Payer: Medicare Other | Attending: Internal Medicine

## 2019-04-15 DIAGNOSIS — Z23 Encounter for immunization: Secondary | ICD-10-CM

## 2019-04-15 NOTE — Progress Notes (Signed)
   Covid-19 Vaccination Clinic  Name:  Jessica Wallace    MRN: [id number] DOB: [birth year]  04/15/2019  Jessica Wallace was observed post Covid-19 immunization for 15 minutes without incident. She was provided with Vaccine Information Sheet and instruction to access the V-Safe system.   Jessica Wallace was instructed to call 911 with any severe reactions post vaccine: Marland Kitchen Difficulty breathing  . Swelling of face and throat  . A fast heartbeat  . A bad rash all over body  . Dizziness and weakness   Immunizations Administered    Name Date Dose VIS Date Route   Pfizer COVID-19 Vaccine 04/15/2019  5:56 PM 0.3 mL 01/21/2019 Intramuscular   Manufacturer: [place]   Lot: [id number]   [place]: [id number]

## 2019-05-11 DIAGNOSIS — E782 Mixed hyperlipidemia: Secondary | ICD-10-CM | POA: Diagnosis not present

## 2019-05-11 DIAGNOSIS — I10 Essential (primary) hypertension: Secondary | ICD-10-CM | POA: Diagnosis not present

## 2019-05-11 DIAGNOSIS — Z7982 Long term (current) use of aspirin: Secondary | ICD-10-CM | POA: Diagnosis not present

## 2019-05-16 DIAGNOSIS — N390 Urinary tract infection, site not specified: Secondary | ICD-10-CM | POA: Diagnosis not present

## 2019-05-16 DIAGNOSIS — I10 Essential (primary) hypertension: Secondary | ICD-10-CM | POA: Diagnosis not present

## 2019-05-20 DIAGNOSIS — M25562 Pain in left knee: Secondary | ICD-10-CM | POA: Diagnosis not present

## 2019-05-20 DIAGNOSIS — Z0000 Encounter for general adult medical examination without abnormal findings: Secondary | ICD-10-CM | POA: Diagnosis not present

## 2019-05-20 DIAGNOSIS — M8580 Other specified disorders of bone density and structure, unspecified site: Secondary | ICD-10-CM | POA: Diagnosis not present

## 2019-05-20 DIAGNOSIS — I10 Essential (primary) hypertension: Secondary | ICD-10-CM | POA: Diagnosis not present

## 2019-05-20 DIAGNOSIS — M25462 Effusion, left knee: Secondary | ICD-10-CM | POA: Diagnosis not present

## 2019-05-20 DIAGNOSIS — M1712 Unilateral primary osteoarthritis, left knee: Secondary | ICD-10-CM | POA: Diagnosis not present

## 2019-05-20 DIAGNOSIS — E782 Mixed hyperlipidemia: Secondary | ICD-10-CM | POA: Diagnosis not present

## 2019-05-24 DIAGNOSIS — M21062 Valgus deformity, not elsewhere classified, left knee: Secondary | ICD-10-CM | POA: Diagnosis not present

## 2019-05-24 DIAGNOSIS — M1990 Unspecified osteoarthritis, unspecified site: Secondary | ICD-10-CM | POA: Diagnosis not present

## 2019-05-24 DIAGNOSIS — Z8739 Personal history of other diseases of the musculoskeletal system and connective tissue: Secondary | ICD-10-CM | POA: Diagnosis not present

## 2019-05-24 DIAGNOSIS — Z872 Personal history of diseases of the skin and subcutaneous tissue: Secondary | ICD-10-CM | POA: Diagnosis not present

## 2019-05-24 DIAGNOSIS — M25562 Pain in left knee: Secondary | ICD-10-CM | POA: Diagnosis not present

## 2019-05-25 DIAGNOSIS — I10 Essential (primary) hypertension: Secondary | ICD-10-CM | POA: Diagnosis not present

## 2019-05-25 DIAGNOSIS — Z0000 Encounter for general adult medical examination without abnormal findings: Secondary | ICD-10-CM | POA: Diagnosis not present

## 2019-06-14 DIAGNOSIS — M25562 Pain in left knee: Secondary | ICD-10-CM | POA: Diagnosis not present

## 2019-06-21 DIAGNOSIS — M810 Age-related osteoporosis without current pathological fracture: Secondary | ICD-10-CM | POA: Diagnosis not present

## 2019-06-21 DIAGNOSIS — I10 Essential (primary) hypertension: Secondary | ICD-10-CM | POA: Diagnosis not present

## 2019-06-28 DIAGNOSIS — Z79899 Other long term (current) drug therapy: Secondary | ICD-10-CM | POA: Diagnosis not present

## 2019-06-28 DIAGNOSIS — Z961 Presence of intraocular lens: Secondary | ICD-10-CM | POA: Diagnosis not present

## 2019-06-28 DIAGNOSIS — H35372 Puckering of macula, left eye: Secondary | ICD-10-CM | POA: Diagnosis not present

## 2019-06-28 DIAGNOSIS — H30033 Focal chorioretinal inflammation, peripheral, bilateral: Secondary | ICD-10-CM | POA: Diagnosis not present

## 2019-07-12 DIAGNOSIS — E782 Mixed hyperlipidemia: Secondary | ICD-10-CM | POA: Diagnosis not present

## 2019-07-12 DIAGNOSIS — M810 Age-related osteoporosis without current pathological fracture: Secondary | ICD-10-CM | POA: Diagnosis not present

## 2019-07-12 DIAGNOSIS — I10 Essential (primary) hypertension: Secondary | ICD-10-CM | POA: Diagnosis not present

## 2019-08-11 DIAGNOSIS — I10 Essential (primary) hypertension: Secondary | ICD-10-CM | POA: Diagnosis not present

## 2019-09-20 DIAGNOSIS — Z961 Presence of intraocular lens: Secondary | ICD-10-CM | POA: Diagnosis not present

## 2019-09-20 DIAGNOSIS — Z79899 Other long term (current) drug therapy: Secondary | ICD-10-CM | POA: Diagnosis not present

## 2019-09-20 DIAGNOSIS — H30033 Focal chorioretinal inflammation, peripheral, bilateral: Secondary | ICD-10-CM | POA: Diagnosis not present

## 2019-09-20 DIAGNOSIS — H35372 Puckering of macula, left eye: Secondary | ICD-10-CM | POA: Diagnosis not present

## 2019-09-22 DIAGNOSIS — H30033 Focal chorioretinal inflammation, peripheral, bilateral: Secondary | ICD-10-CM | POA: Diagnosis not present

## 2019-09-22 DIAGNOSIS — Z79899 Other long term (current) drug therapy: Secondary | ICD-10-CM | POA: Diagnosis not present

## 2019-12-20 DIAGNOSIS — M21062 Valgus deformity, not elsewhere classified, left knee: Secondary | ICD-10-CM | POA: Diagnosis not present

## 2019-12-20 DIAGNOSIS — M25562 Pain in left knee: Secondary | ICD-10-CM | POA: Diagnosis not present

## 2019-12-20 DIAGNOSIS — Z872 Personal history of diseases of the skin and subcutaneous tissue: Secondary | ICD-10-CM | POA: Diagnosis not present

## 2019-12-20 DIAGNOSIS — M1990 Unspecified osteoarthritis, unspecified site: Secondary | ICD-10-CM | POA: Diagnosis not present

## 2020-01-24 DIAGNOSIS — H30033 Focal chorioretinal inflammation, peripheral, bilateral: Secondary | ICD-10-CM | POA: Diagnosis not present

## 2020-01-24 DIAGNOSIS — H35372 Puckering of macula, left eye: Secondary | ICD-10-CM | POA: Diagnosis not present

## 2020-01-24 DIAGNOSIS — Z961 Presence of intraocular lens: Secondary | ICD-10-CM | POA: Diagnosis not present

## 2020-01-24 DIAGNOSIS — Z79899 Other long term (current) drug therapy: Secondary | ICD-10-CM | POA: Diagnosis not present

## 2020-01-26 DIAGNOSIS — H30033 Focal chorioretinal inflammation, peripheral, bilateral: Secondary | ICD-10-CM | POA: Diagnosis not present

## 2020-01-26 DIAGNOSIS — Z79899 Other long term (current) drug therapy: Secondary | ICD-10-CM | POA: Diagnosis not present

## 2020-01-30 NOTE — Progress Notes (Signed)
Subjective:    CC: L knee pain  I, Molly Weber, LAT, ATC, am serving as scribe for Dr. Lynne Leader.  HPI: Pt is a 74 y/o female presenting w/ c/o L knee pain . Pt reports she landed on her knee going down the steps in 1969. Knee bothers her off and on. She describes it as "going out and snaps back into place." She locates her pain to all over knee and describes pain as an ache. Pt was referred to PT by PCP but pt refused do to pain.   L knee swelling: yes and into lower leg/ankle L knee mechanical symptoms: no Aggravating factors: bike, after sitting for awhile Treatments tried: wrap  Pertinent review of Systems: No fevers or chills  Relevant historical information: Hypertension, hyperlipidemia, osteopenia   Objective:    Vitals:   01/31/20 0848  BP: 130/86  Pulse: (!) 56  SpO2: 98%   General: Well Developed, well nourished, and in no acute distress.   MSK: Left knee genu valgus appearance. Normal motion with crepitation. Intact strength of flexion and extension. Laxity to anterior and posterior drawer testing.  No significant laxity to valgus varus stress testing. Negative McMurray's test.  Lab and Radiology Results X-ray images left knee obtained today personally and independently interpreted Moderate to severe lateral compartment DJD.  Moderate patellofemoral DJD.  No acute fractures. Await formal radiology review   Impression and Recommendations:    Assessment and Plan: 74 y.o. female with left knee pain due to osteoarthritis.  Patient likely has a PCL deficient knee likely from a result of her knee injury in 1969.  This probably does contribute to her development of arthritis.  However reconstructing PCL probably would not improve her arthritis picture or improve her functional state at this point.  Spent some time discussing treatment options.  She would like to proceed with more conservative options.  Plan for Tylenol arthritis and Voltaren gel.   Stressed the importance of physical therapy for both knee pain and gait improvement and falls reduction.  She does not drive so will use home health physical therapy.  Plan to check back in about 6 weeks.  At that point could consider steroid or even hyaluronic acid injections if needed.  Also spent some time discussing that ultimately her knee likely will require total knee replacement.  She is not ready to consider this at this point.  PDMP not reviewed this encounter. Orders Placed This Encounter  Procedures  . DG Knee AP/LAT W/Sun[place] Left    Standing Status:   Future    Number of Occurrences:   1    Standing Expiration Date:   01/30/2021    Order Specific Question:   Reason for Exam (SYMPTOM  OR DIAGNOSIS REQUIRED)    Answer:   chronic left knee pain    Order Specific Question:   Preferred imaging location?    Answer:   [facility]  . Ambulatory referral to Home Health    Referral Priority:   Routine    Referral Type:   Home Health Care    Referral Reason:   Specialty Services Required    Requested Specialty:   [place]    Number of Visits Requested:   1   No orders of the defined types were placed in this encounter.   Discussed warning signs or symptoms. Please see discharge instructions. Patient expresses understanding.   The above documentation has been reviewed and is accurate and complete Ellard Artis  Georgina Snell, M.D.

## 2020-01-31 ENCOUNTER — Encounter: Payer: Self-pay | Admitting: Family Medicine

## 2020-01-31 ENCOUNTER — Other Ambulatory Visit: Payer: Self-pay

## 2020-01-31 ENCOUNTER — Ambulatory Visit: Payer: Medicare Other | Admitting: Family Medicine

## 2020-01-31 ENCOUNTER — Ambulatory Visit (INDEPENDENT_AMBULATORY_CARE_PROVIDER_SITE_OTHER): Payer: Medicare Other

## 2020-01-31 VITALS — BP 130/86 | HR 56 | Ht 63.0 in | Wt 168.2 lb

## 2020-01-31 DIAGNOSIS — G8929 Other chronic pain: Secondary | ICD-10-CM

## 2020-01-31 DIAGNOSIS — Z7982 Long term (current) use of aspirin: Secondary | ICD-10-CM | POA: Diagnosis not present

## 2020-01-31 DIAGNOSIS — Z9049 Acquired absence of other specified parts of digestive tract: Secondary | ICD-10-CM | POA: Insufficient documentation

## 2020-01-31 DIAGNOSIS — M810 Age-related osteoporosis without current pathological fracture: Secondary | ICD-10-CM | POA: Insufficient documentation

## 2020-01-31 DIAGNOSIS — I10 Essential (primary) hypertension: Secondary | ICD-10-CM | POA: Insufficient documentation

## 2020-01-31 DIAGNOSIS — E782 Mixed hyperlipidemia: Secondary | ICD-10-CM | POA: Insufficient documentation

## 2020-01-31 DIAGNOSIS — H209 Unspecified iridocyclitis: Secondary | ICD-10-CM | POA: Insufficient documentation

## 2020-01-31 DIAGNOSIS — M8580 Other specified disorders of bone density and structure, unspecified site: Secondary | ICD-10-CM | POA: Diagnosis not present

## 2020-01-31 DIAGNOSIS — M1990 Unspecified osteoarthritis, unspecified site: Secondary | ICD-10-CM | POA: Insufficient documentation

## 2020-01-31 DIAGNOSIS — H35033 Hypertensive retinopathy, bilateral: Secondary | ICD-10-CM | POA: Diagnosis not present

## 2020-01-31 DIAGNOSIS — D849 Immunodeficiency, unspecified: Secondary | ICD-10-CM | POA: Diagnosis not present

## 2020-01-31 DIAGNOSIS — I129 Hypertensive chronic kidney disease with stage 1 through stage 4 chronic kidney disease, or unspecified chronic kidney disease: Secondary | ICD-10-CM | POA: Diagnosis not present

## 2020-01-31 DIAGNOSIS — Z9181 History of falling: Secondary | ICD-10-CM | POA: Diagnosis not present

## 2020-01-31 DIAGNOSIS — Z8739 Personal history of other diseases of the musculoskeletal system and connective tissue: Secondary | ICD-10-CM | POA: Insufficient documentation

## 2020-01-31 DIAGNOSIS — M25562 Pain in left knee: Secondary | ICD-10-CM

## 2020-01-31 DIAGNOSIS — N183 Chronic kidney disease, stage 3 unspecified: Secondary | ICD-10-CM | POA: Insufficient documentation

## 2020-01-31 DIAGNOSIS — R413 Other amnesia: Secondary | ICD-10-CM | POA: Insufficient documentation

## 2020-01-31 DIAGNOSIS — I700 Atherosclerosis of aorta: Secondary | ICD-10-CM | POA: Insufficient documentation

## 2020-01-31 DIAGNOSIS — M1712 Unilateral primary osteoarthritis, left knee: Secondary | ICD-10-CM | POA: Diagnosis not present

## 2020-01-31 DIAGNOSIS — L409 Psoriasis, unspecified: Secondary | ICD-10-CM | POA: Insufficient documentation

## 2020-01-31 DIAGNOSIS — N8111 Cystocele, midline: Secondary | ICD-10-CM | POA: Insufficient documentation

## 2020-01-31 DIAGNOSIS — Z6830 Body mass index (BMI) 30.0-30.9, adult: Secondary | ICD-10-CM | POA: Insufficient documentation

## 2020-01-31 HISTORY — DX: Other chronic pain: G89.29

## 2020-01-31 NOTE — Patient Instructions (Addendum)
Thank you for coming in today.  Please get an Xray today before you leave  Please use voltaren gel up to 4x daily for pain as needed.   Tylenol arthritis is a good idea. Just add it to the morning medicine.    Home health PT should contact you in 1 week or so.  Let me know if they dont or if there is a problem.    Lets recheck in 6 weeks.

## 2020-02-01 NOTE — Progress Notes (Signed)
X-ray left knee shows mild arthritis.

## 2020-02-06 DIAGNOSIS — M25562 Pain in left knee: Secondary | ICD-10-CM | POA: Diagnosis not present

## 2020-02-06 DIAGNOSIS — Z7982 Long term (current) use of aspirin: Secondary | ICD-10-CM | POA: Diagnosis not present

## 2020-02-06 DIAGNOSIS — H35033 Hypertensive retinopathy, bilateral: Secondary | ICD-10-CM | POA: Diagnosis not present

## 2020-02-06 DIAGNOSIS — G8929 Other chronic pain: Secondary | ICD-10-CM | POA: Diagnosis not present

## 2020-02-06 DIAGNOSIS — D849 Immunodeficiency, unspecified: Secondary | ICD-10-CM | POA: Diagnosis not present

## 2020-02-06 DIAGNOSIS — Z9181 History of falling: Secondary | ICD-10-CM | POA: Diagnosis not present

## 2020-02-06 DIAGNOSIS — E782 Mixed hyperlipidemia: Secondary | ICD-10-CM | POA: Diagnosis not present

## 2020-02-06 DIAGNOSIS — M810 Age-related osteoporosis without current pathological fracture: Secondary | ICD-10-CM | POA: Diagnosis not present

## 2020-02-06 DIAGNOSIS — M8580 Other specified disorders of bone density and structure, unspecified site: Secondary | ICD-10-CM | POA: Diagnosis not present

## 2020-02-06 DIAGNOSIS — R413 Other amnesia: Secondary | ICD-10-CM | POA: Diagnosis not present

## 2020-02-06 DIAGNOSIS — N183 Chronic kidney disease, stage 3 unspecified: Secondary | ICD-10-CM | POA: Diagnosis not present

## 2020-02-06 DIAGNOSIS — I700 Atherosclerosis of aorta: Secondary | ICD-10-CM | POA: Diagnosis not present

## 2020-02-06 DIAGNOSIS — M1712 Unilateral primary osteoarthritis, left knee: Secondary | ICD-10-CM | POA: Diagnosis not present

## 2020-02-06 DIAGNOSIS — I129 Hypertensive chronic kidney disease with stage 1 through stage 4 chronic kidney disease, or unspecified chronic kidney disease: Secondary | ICD-10-CM | POA: Diagnosis not present

## 2020-02-09 DIAGNOSIS — R413 Other amnesia: Secondary | ICD-10-CM | POA: Diagnosis not present

## 2020-02-09 DIAGNOSIS — H35033 Hypertensive retinopathy, bilateral: Secondary | ICD-10-CM | POA: Diagnosis not present

## 2020-02-09 DIAGNOSIS — D849 Immunodeficiency, unspecified: Secondary | ICD-10-CM | POA: Diagnosis not present

## 2020-02-09 DIAGNOSIS — I700 Atherosclerosis of aorta: Secondary | ICD-10-CM | POA: Diagnosis not present

## 2020-02-09 DIAGNOSIS — N183 Chronic kidney disease, stage 3 unspecified: Secondary | ICD-10-CM | POA: Diagnosis not present

## 2020-02-09 DIAGNOSIS — G8929 Other chronic pain: Secondary | ICD-10-CM | POA: Diagnosis not present

## 2020-02-09 DIAGNOSIS — M8580 Other specified disorders of bone density and structure, unspecified site: Secondary | ICD-10-CM | POA: Diagnosis not present

## 2020-02-09 DIAGNOSIS — M1712 Unilateral primary osteoarthritis, left knee: Secondary | ICD-10-CM | POA: Diagnosis not present

## 2020-02-09 DIAGNOSIS — Z7982 Long term (current) use of aspirin: Secondary | ICD-10-CM | POA: Diagnosis not present

## 2020-02-09 DIAGNOSIS — M25562 Pain in left knee: Secondary | ICD-10-CM | POA: Diagnosis not present

## 2020-02-09 DIAGNOSIS — Z9181 History of falling: Secondary | ICD-10-CM | POA: Diagnosis not present

## 2020-02-09 DIAGNOSIS — I129 Hypertensive chronic kidney disease with stage 1 through stage 4 chronic kidney disease, or unspecified chronic kidney disease: Secondary | ICD-10-CM | POA: Diagnosis not present

## 2020-02-09 DIAGNOSIS — E782 Mixed hyperlipidemia: Secondary | ICD-10-CM | POA: Diagnosis not present

## 2020-02-09 DIAGNOSIS — M810 Age-related osteoporosis without current pathological fracture: Secondary | ICD-10-CM | POA: Diagnosis not present

## 2020-02-15 DIAGNOSIS — I700 Atherosclerosis of aorta: Secondary | ICD-10-CM | POA: Diagnosis not present

## 2020-02-15 DIAGNOSIS — E782 Mixed hyperlipidemia: Secondary | ICD-10-CM | POA: Diagnosis not present

## 2020-02-15 DIAGNOSIS — Z9181 History of falling: Secondary | ICD-10-CM | POA: Diagnosis not present

## 2020-02-15 DIAGNOSIS — D849 Immunodeficiency, unspecified: Secondary | ICD-10-CM | POA: Diagnosis not present

## 2020-02-15 DIAGNOSIS — G8929 Other chronic pain: Secondary | ICD-10-CM | POA: Diagnosis not present

## 2020-02-15 DIAGNOSIS — Z7982 Long term (current) use of aspirin: Secondary | ICD-10-CM | POA: Diagnosis not present

## 2020-02-15 DIAGNOSIS — M810 Age-related osteoporosis without current pathological fracture: Secondary | ICD-10-CM | POA: Diagnosis not present

## 2020-02-15 DIAGNOSIS — M8580 Other specified disorders of bone density and structure, unspecified site: Secondary | ICD-10-CM | POA: Diagnosis not present

## 2020-02-15 DIAGNOSIS — N183 Chronic kidney disease, stage 3 unspecified: Secondary | ICD-10-CM | POA: Diagnosis not present

## 2020-02-15 DIAGNOSIS — H35033 Hypertensive retinopathy, bilateral: Secondary | ICD-10-CM | POA: Diagnosis not present

## 2020-02-15 DIAGNOSIS — M25562 Pain in left knee: Secondary | ICD-10-CM | POA: Diagnosis not present

## 2020-02-15 DIAGNOSIS — I129 Hypertensive chronic kidney disease with stage 1 through stage 4 chronic kidney disease, or unspecified chronic kidney disease: Secondary | ICD-10-CM | POA: Diagnosis not present

## 2020-02-15 DIAGNOSIS — R413 Other amnesia: Secondary | ICD-10-CM | POA: Diagnosis not present

## 2020-02-15 DIAGNOSIS — M1712 Unilateral primary osteoarthritis, left knee: Secondary | ICD-10-CM | POA: Diagnosis not present

## 2020-02-17 DIAGNOSIS — Z7982 Long term (current) use of aspirin: Secondary | ICD-10-CM | POA: Diagnosis not present

## 2020-02-17 DIAGNOSIS — M25562 Pain in left knee: Secondary | ICD-10-CM | POA: Diagnosis not present

## 2020-02-17 DIAGNOSIS — G8929 Other chronic pain: Secondary | ICD-10-CM | POA: Diagnosis not present

## 2020-02-17 DIAGNOSIS — E782 Mixed hyperlipidemia: Secondary | ICD-10-CM | POA: Diagnosis not present

## 2020-02-17 DIAGNOSIS — H35033 Hypertensive retinopathy, bilateral: Secondary | ICD-10-CM | POA: Diagnosis not present

## 2020-02-17 DIAGNOSIS — I700 Atherosclerosis of aorta: Secondary | ICD-10-CM | POA: Diagnosis not present

## 2020-02-17 DIAGNOSIS — M8580 Other specified disorders of bone density and structure, unspecified site: Secondary | ICD-10-CM | POA: Diagnosis not present

## 2020-02-17 DIAGNOSIS — R413 Other amnesia: Secondary | ICD-10-CM | POA: Diagnosis not present

## 2020-02-17 DIAGNOSIS — I129 Hypertensive chronic kidney disease with stage 1 through stage 4 chronic kidney disease, or unspecified chronic kidney disease: Secondary | ICD-10-CM | POA: Diagnosis not present

## 2020-02-17 DIAGNOSIS — N183 Chronic kidney disease, stage 3 unspecified: Secondary | ICD-10-CM | POA: Diagnosis not present

## 2020-02-17 DIAGNOSIS — M1712 Unilateral primary osteoarthritis, left knee: Secondary | ICD-10-CM | POA: Diagnosis not present

## 2020-02-17 DIAGNOSIS — Z9181 History of falling: Secondary | ICD-10-CM | POA: Diagnosis not present

## 2020-02-17 DIAGNOSIS — D849 Immunodeficiency, unspecified: Secondary | ICD-10-CM | POA: Diagnosis not present

## 2020-02-17 DIAGNOSIS — M810 Age-related osteoporosis without current pathological fracture: Secondary | ICD-10-CM | POA: Diagnosis not present

## 2020-02-20 DIAGNOSIS — Z9181 History of falling: Secondary | ICD-10-CM | POA: Diagnosis not present

## 2020-02-20 DIAGNOSIS — H35033 Hypertensive retinopathy, bilateral: Secondary | ICD-10-CM | POA: Diagnosis not present

## 2020-02-20 DIAGNOSIS — I129 Hypertensive chronic kidney disease with stage 1 through stage 4 chronic kidney disease, or unspecified chronic kidney disease: Secondary | ICD-10-CM | POA: Diagnosis not present

## 2020-02-20 DIAGNOSIS — I700 Atherosclerosis of aorta: Secondary | ICD-10-CM | POA: Diagnosis not present

## 2020-02-20 DIAGNOSIS — M1712 Unilateral primary osteoarthritis, left knee: Secondary | ICD-10-CM | POA: Diagnosis not present

## 2020-02-20 DIAGNOSIS — E782 Mixed hyperlipidemia: Secondary | ICD-10-CM | POA: Diagnosis not present

## 2020-02-20 DIAGNOSIS — Z7982 Long term (current) use of aspirin: Secondary | ICD-10-CM | POA: Diagnosis not present

## 2020-02-20 DIAGNOSIS — M8580 Other specified disorders of bone density and structure, unspecified site: Secondary | ICD-10-CM | POA: Diagnosis not present

## 2020-02-20 DIAGNOSIS — N183 Chronic kidney disease, stage 3 unspecified: Secondary | ICD-10-CM | POA: Diagnosis not present

## 2020-02-20 DIAGNOSIS — R413 Other amnesia: Secondary | ICD-10-CM | POA: Diagnosis not present

## 2020-02-20 DIAGNOSIS — D849 Immunodeficiency, unspecified: Secondary | ICD-10-CM | POA: Diagnosis not present

## 2020-02-20 DIAGNOSIS — G8929 Other chronic pain: Secondary | ICD-10-CM | POA: Diagnosis not present

## 2020-02-20 DIAGNOSIS — M810 Age-related osteoporosis without current pathological fracture: Secondary | ICD-10-CM | POA: Diagnosis not present

## 2020-02-20 DIAGNOSIS — M25562 Pain in left knee: Secondary | ICD-10-CM | POA: Diagnosis not present

## 2020-02-23 DIAGNOSIS — Z7982 Long term (current) use of aspirin: Secondary | ICD-10-CM | POA: Diagnosis not present

## 2020-02-23 DIAGNOSIS — H35033 Hypertensive retinopathy, bilateral: Secondary | ICD-10-CM | POA: Diagnosis not present

## 2020-02-23 DIAGNOSIS — I700 Atherosclerosis of aorta: Secondary | ICD-10-CM | POA: Diagnosis not present

## 2020-02-23 DIAGNOSIS — M810 Age-related osteoporosis without current pathological fracture: Secondary | ICD-10-CM | POA: Diagnosis not present

## 2020-02-23 DIAGNOSIS — I129 Hypertensive chronic kidney disease with stage 1 through stage 4 chronic kidney disease, or unspecified chronic kidney disease: Secondary | ICD-10-CM | POA: Diagnosis not present

## 2020-02-23 DIAGNOSIS — Z9181 History of falling: Secondary | ICD-10-CM | POA: Diagnosis not present

## 2020-02-23 DIAGNOSIS — R413 Other amnesia: Secondary | ICD-10-CM | POA: Diagnosis not present

## 2020-02-23 DIAGNOSIS — G8929 Other chronic pain: Secondary | ICD-10-CM | POA: Diagnosis not present

## 2020-02-23 DIAGNOSIS — M8580 Other specified disorders of bone density and structure, unspecified site: Secondary | ICD-10-CM | POA: Diagnosis not present

## 2020-02-23 DIAGNOSIS — D849 Immunodeficiency, unspecified: Secondary | ICD-10-CM | POA: Diagnosis not present

## 2020-02-23 DIAGNOSIS — E782 Mixed hyperlipidemia: Secondary | ICD-10-CM | POA: Diagnosis not present

## 2020-02-23 DIAGNOSIS — N183 Chronic kidney disease, stage 3 unspecified: Secondary | ICD-10-CM | POA: Diagnosis not present

## 2020-02-23 DIAGNOSIS — M25562 Pain in left knee: Secondary | ICD-10-CM | POA: Diagnosis not present

## 2020-02-23 DIAGNOSIS — M1712 Unilateral primary osteoarthritis, left knee: Secondary | ICD-10-CM | POA: Diagnosis not present

## 2020-03-06 DIAGNOSIS — Z7982 Long term (current) use of aspirin: Secondary | ICD-10-CM | POA: Diagnosis not present

## 2020-03-06 DIAGNOSIS — M8580 Other specified disorders of bone density and structure, unspecified site: Secondary | ICD-10-CM | POA: Diagnosis not present

## 2020-03-06 DIAGNOSIS — G8929 Other chronic pain: Secondary | ICD-10-CM | POA: Diagnosis not present

## 2020-03-06 DIAGNOSIS — R413 Other amnesia: Secondary | ICD-10-CM | POA: Diagnosis not present

## 2020-03-06 DIAGNOSIS — E782 Mixed hyperlipidemia: Secondary | ICD-10-CM | POA: Diagnosis not present

## 2020-03-06 DIAGNOSIS — H35033 Hypertensive retinopathy, bilateral: Secondary | ICD-10-CM | POA: Diagnosis not present

## 2020-03-06 DIAGNOSIS — N183 Chronic kidney disease, stage 3 unspecified: Secondary | ICD-10-CM | POA: Diagnosis not present

## 2020-03-06 DIAGNOSIS — I700 Atherosclerosis of aorta: Secondary | ICD-10-CM | POA: Diagnosis not present

## 2020-03-06 DIAGNOSIS — D849 Immunodeficiency, unspecified: Secondary | ICD-10-CM | POA: Diagnosis not present

## 2020-03-06 DIAGNOSIS — M25562 Pain in left knee: Secondary | ICD-10-CM | POA: Diagnosis not present

## 2020-03-06 DIAGNOSIS — M810 Age-related osteoporosis without current pathological fracture: Secondary | ICD-10-CM | POA: Diagnosis not present

## 2020-03-06 DIAGNOSIS — I129 Hypertensive chronic kidney disease with stage 1 through stage 4 chronic kidney disease, or unspecified chronic kidney disease: Secondary | ICD-10-CM | POA: Diagnosis not present

## 2020-03-06 DIAGNOSIS — Z9181 History of falling: Secondary | ICD-10-CM | POA: Diagnosis not present

## 2020-03-06 DIAGNOSIS — M1712 Unilateral primary osteoarthritis, left knee: Secondary | ICD-10-CM | POA: Diagnosis not present

## 2020-03-09 NOTE — Progress Notes (Signed)
I, Peterson Lombard, LAT, ATC acting as a scribe for Lynne Leader, MD.  Jessica Wallace is a 75 y.o. female who presents to [place] at [facility] today for chronic L knee pain. Pt reports she landed on her knee going down the steps in 1969. Knee bothers her off and on. She describes it as "going out and snaps back into place." She locates her pain to all over knee and describes pain as an ache. Pt was last seen by Dr. Georgina Snell on 01/31/20 and was advised to try Tylenol arthritis and Voltaren gel. Today, pt reports L knee feels about the same. Pt describes pain as achy.  She has been doing home health physical therapy which has been helping for balance and gait training.  L knee swelling: same L knee mechanical symptoms: no Aggravating factors:  Treatments tried: Voltaren gel  Dx imaging: 01/31/20 L knee XR  Pertinent review of systems: No fevers or chills  Relevant historical information: CKD, memory loss, mental deficiency.   Exam:  BP 122/84 (BP Location: Left Arm, Patient Position: Sitting, Cuff Size: Normal)   Pulse 61   Ht 5\' 3"  (1.6 m)   Wt 165 lb 9.6 oz (75.1 kg)   SpO2 97%   BMI 29.33 kg/m  General: Well Developed, well nourished, and in no acute distress.   MSK: Left knee moderate effusion normal motion with crepitation.  Mildly tender palpation medial and lateral joint line.    Lab and Radiology Results   Procedure: Real-time Ultrasound Guided Injection of left knee superior lateral patellar space Device: Philips Affiniti 50G Images permanently stored and available for review in PACS Verbal informed consent obtained.  Discussed risks and benefits of procedure. Warned about infection bleeding damage to structures skin hypopigmentation and fat atrophy among others. Patient expresses understanding and agreement Time-out conducted.   Noted no overlying erythema, induration, or other signs of local infection.   Skin prepped in a sterile fashion.    Local anesthesia: Topical Ethyl chloride.   With sterile technique and under real time ultrasound guidance:  40 mg of Kenalog and 2 mL of Marcaine injected into knee joint. Fluid seen entering the joint capsule.   Completed without difficulty   Pain immediately resolved suggesting accurate placement of the medication.   Advised to call if fevers/chills, erythema, induration, drainage, or persistent bleeding.   Images permanently stored and available for review in the ultrasound unit.  Impression: Technically successful ultrasound guided injection.         Assessment and Plan: 75 y.o. female with left knee pain due to DJD.  Thought to have a PCL deficiency as well.  It is a bit hard to tell how much she is bothered by her knee.  She is significantly minimizing her symptoms according to her daughter.  She reports significant gait disturbance and seems to be a bit frail with walking according to her daughter.  Plan to continue home health PT and home exercise program.  Discussed options.  Plan to proceed with steroid injection in the knee today.  Recheck back with me as needed.   PDMP not reviewed this encounter. Orders Placed This Encounter  Procedures  . [place] LIMITED JOINT SPACE STRUCTURES LOW LEFT(NO LINKED CHARGES)    Standing Status:   Future    Number of Occurrences:   1    Standing Expiration Date:   09/09/2020    Order Specific Question:   Reason for Exam (SYMPTOM  OR DIAGNOSIS REQUIRED)  Answer:   chronic left knee pain    Order Specific Question:   Preferred imaging location?    Answer:   [place]   No orders of the defined types were placed in this encounter.    Discussed warning signs or symptoms. Please see discharge instructions. Patient expresses understanding.   The above documentation has been reviewed and is accurate and complete Lynne Leader, M.D.

## 2020-03-12 ENCOUNTER — Ambulatory Visit: Payer: Medicare Other | Admitting: Family Medicine

## 2020-03-12 ENCOUNTER — Ambulatory Visit: Payer: Self-pay

## 2020-03-12 ENCOUNTER — Other Ambulatory Visit: Payer: Self-pay

## 2020-03-12 VITALS — BP 122/84 | HR 61 | Ht 63.0 in | Wt 165.6 lb

## 2020-03-12 DIAGNOSIS — M25562 Pain in left knee: Secondary | ICD-10-CM

## 2020-03-12 DIAGNOSIS — G8929 Other chronic pain: Secondary | ICD-10-CM

## 2020-03-12 NOTE — Patient Instructions (Signed)
Thank you for coming in today.  Call or go to the ER if you develop a large red swollen joint with extreme pain or oozing puss.   Continue the exercises with PT.   Work on Higher education careers adviser. This will help prevent a fall.   Recheck with me as needed.

## 2020-03-13 DIAGNOSIS — G8929 Other chronic pain: Secondary | ICD-10-CM | POA: Diagnosis not present

## 2020-03-13 DIAGNOSIS — E782 Mixed hyperlipidemia: Secondary | ICD-10-CM | POA: Diagnosis not present

## 2020-03-13 DIAGNOSIS — M1712 Unilateral primary osteoarthritis, left knee: Secondary | ICD-10-CM | POA: Diagnosis not present

## 2020-03-13 DIAGNOSIS — Z7982 Long term (current) use of aspirin: Secondary | ICD-10-CM | POA: Diagnosis not present

## 2020-03-13 DIAGNOSIS — N183 Chronic kidney disease, stage 3 unspecified: Secondary | ICD-10-CM | POA: Diagnosis not present

## 2020-03-13 DIAGNOSIS — Z9181 History of falling: Secondary | ICD-10-CM | POA: Diagnosis not present

## 2020-03-13 DIAGNOSIS — R413 Other amnesia: Secondary | ICD-10-CM | POA: Diagnosis not present

## 2020-03-13 DIAGNOSIS — D849 Immunodeficiency, unspecified: Secondary | ICD-10-CM | POA: Diagnosis not present

## 2020-03-13 DIAGNOSIS — I129 Hypertensive chronic kidney disease with stage 1 through stage 4 chronic kidney disease, or unspecified chronic kidney disease: Secondary | ICD-10-CM | POA: Diagnosis not present

## 2020-03-13 DIAGNOSIS — M810 Age-related osteoporosis without current pathological fracture: Secondary | ICD-10-CM | POA: Diagnosis not present

## 2020-03-13 DIAGNOSIS — I700 Atherosclerosis of aorta: Secondary | ICD-10-CM | POA: Diagnosis not present

## 2020-03-13 DIAGNOSIS — M8580 Other specified disorders of bone density and structure, unspecified site: Secondary | ICD-10-CM | POA: Diagnosis not present

## 2020-03-13 DIAGNOSIS — M25562 Pain in left knee: Secondary | ICD-10-CM | POA: Diagnosis not present

## 2020-03-13 DIAGNOSIS — H35033 Hypertensive retinopathy, bilateral: Secondary | ICD-10-CM | POA: Diagnosis not present

## 2020-03-14 ENCOUNTER — Ambulatory Visit: Payer: Medicare Other | Admitting: Family Medicine

## 2020-03-20 DIAGNOSIS — Z7982 Long term (current) use of aspirin: Secondary | ICD-10-CM | POA: Diagnosis not present

## 2020-03-20 DIAGNOSIS — M810 Age-related osteoporosis without current pathological fracture: Secondary | ICD-10-CM | POA: Diagnosis not present

## 2020-03-20 DIAGNOSIS — M8580 Other specified disorders of bone density and structure, unspecified site: Secondary | ICD-10-CM | POA: Diagnosis not present

## 2020-03-20 DIAGNOSIS — N183 Chronic kidney disease, stage 3 unspecified: Secondary | ICD-10-CM | POA: Diagnosis not present

## 2020-03-20 DIAGNOSIS — G8929 Other chronic pain: Secondary | ICD-10-CM | POA: Diagnosis not present

## 2020-03-20 DIAGNOSIS — I700 Atherosclerosis of aorta: Secondary | ICD-10-CM | POA: Diagnosis not present

## 2020-03-20 DIAGNOSIS — R413 Other amnesia: Secondary | ICD-10-CM | POA: Diagnosis not present

## 2020-03-20 DIAGNOSIS — D849 Immunodeficiency, unspecified: Secondary | ICD-10-CM | POA: Diagnosis not present

## 2020-03-20 DIAGNOSIS — H35033 Hypertensive retinopathy, bilateral: Secondary | ICD-10-CM | POA: Diagnosis not present

## 2020-03-20 DIAGNOSIS — I129 Hypertensive chronic kidney disease with stage 1 through stage 4 chronic kidney disease, or unspecified chronic kidney disease: Secondary | ICD-10-CM | POA: Diagnosis not present

## 2020-03-20 DIAGNOSIS — M25562 Pain in left knee: Secondary | ICD-10-CM | POA: Diagnosis not present

## 2020-03-20 DIAGNOSIS — M1712 Unilateral primary osteoarthritis, left knee: Secondary | ICD-10-CM | POA: Diagnosis not present

## 2020-03-20 DIAGNOSIS — Z9181 History of falling: Secondary | ICD-10-CM | POA: Diagnosis not present

## 2020-03-20 DIAGNOSIS — E782 Mixed hyperlipidemia: Secondary | ICD-10-CM | POA: Diagnosis not present

## 2020-05-01 DIAGNOSIS — H35372 Puckering of macula, left eye: Secondary | ICD-10-CM | POA: Diagnosis not present

## 2020-05-01 DIAGNOSIS — Z961 Presence of intraocular lens: Secondary | ICD-10-CM | POA: Diagnosis not present

## 2020-05-01 DIAGNOSIS — H30033 Focal chorioretinal inflammation, peripheral, bilateral: Secondary | ICD-10-CM | POA: Diagnosis not present

## 2020-05-01 DIAGNOSIS — Z79899 Other long term (current) drug therapy: Secondary | ICD-10-CM | POA: Diagnosis not present

## 2020-05-10 DIAGNOSIS — I129 Hypertensive chronic kidney disease with stage 1 through stage 4 chronic kidney disease, or unspecified chronic kidney disease: Secondary | ICD-10-CM | POA: Diagnosis not present

## 2020-05-10 DIAGNOSIS — M1990 Unspecified osteoarthritis, unspecified site: Secondary | ICD-10-CM | POA: Diagnosis not present

## 2020-05-10 DIAGNOSIS — E782 Mixed hyperlipidemia: Secondary | ICD-10-CM | POA: Diagnosis not present

## 2020-05-10 DIAGNOSIS — N183 Chronic kidney disease, stage 3 unspecified: Secondary | ICD-10-CM | POA: Diagnosis not present

## 2020-05-16 DIAGNOSIS — I10 Essential (primary) hypertension: Secondary | ICD-10-CM | POA: Diagnosis not present

## 2020-05-16 NOTE — Progress Notes (Signed)
   I, Wendy Poet, LAT, ATC, am serving as scribe for Dr. Lynne Leader.  Jessica Wallace is a 75 y.o. female who presents to [place] at [facility] today for f/u of L knee pain.  She was last seen by Dr. Georgina Snell on 03/12/20 w/ no change noted in her L knee.  She had a L knee injection at her last visit and has completed home health PT.  Since her last visit, pt reports that her L knee is feeling worse over the past few days that she may attribute to doing more yardwork recently.  She has swelling at there L medial knee.  Aggravating factors include transitioning from sit-to-stand and walking short distances. The injection she had at her last visit only helped for a few days and then pain returned.  Diagnostic testing: L knee XR- 01/31/20  Pertinent review of systems: No fevers or chills  Relevant historical information: Arthritis.  Kidney disease.  Hypertension.  Minimal exercise   Exam:  BP 128/70 (BP Location: Right Arm, Patient Position: Sitting, Cuff Size: Normal)   Pulse 62   Ht 5\' 3"  (1.6 m)   Wt 163 lb 3.2 oz (74 kg)   SpO2 96%   BMI 28.91 kg/m  General: Well Developed, well nourished, and in no acute distress.   MSK: Left knee moderate joint effusion.  Genu valgus deformity.  Normal motion with crepitation.    Lab and Radiology Results  EXAM: LEFT KNEE 3 VIEWS  COMPARISON:  05/20/2019  FINDINGS: Mild tricompartmental degenerative changes, most prominent in the lateral compartment.  No fracture or dislocation is seen.  Visualized soft tissues are within normal limits.  No suprapatellar knee joint effusion.  IMPRESSION: Mild tricompartmental degenerative changes.   Electronically Signed   By: Julian Hy M.D.   On: 01/31/2020 14:28  I, Lynne Leader, personally (independently) visualized and performed the interpretation of the images attached in this note.     Assessment and Plan: 75 y.o. female with left knee pain due to  moderate to severe DJD worse at the lateral compartment. Kinslie had a steroid injection in her left knee in late January with very little benefit.  Additionally she had a trial of home health PT.  Neither intervention help very much.  She is bothered by her knee pain and it is limiting her activity.  After discussion with the patient and her daughter will proceed with trial of gel injection.  We will work on authorization now for hyaluronic acid injection.  Of note Jessica Wallace is minimizing her symptoms and with cooperation with her daughter she is experiencing worse dysfunction and pain that she indicates in clinic.  Additionally she is not good at exercising or following PT instructions and therefore may not be a very good candidate for total knee replacement as she very likely will not have good follow-through with the post surgery rehab protocol.   PDMP not reviewed this encounter. No orders of the defined types were placed in this encounter.  No orders of the defined types were placed in this encounter.    Discussed warning signs or symptoms. Please see discharge instructions. Patient expresses understanding.   The above documentation has been reviewed and is accurate and complete Lynne Leader, M.D.

## 2020-05-17 ENCOUNTER — Other Ambulatory Visit: Payer: Self-pay

## 2020-05-17 ENCOUNTER — Ambulatory Visit: Payer: Medicare Other | Admitting: Family Medicine

## 2020-05-17 ENCOUNTER — Ambulatory Visit: Payer: Self-pay

## 2020-05-17 VITALS — BP 128/70 | HR 62 | Ht 63.0 in | Wt 163.2 lb

## 2020-05-17 DIAGNOSIS — G8929 Other chronic pain: Secondary | ICD-10-CM | POA: Diagnosis not present

## 2020-05-17 DIAGNOSIS — M25562 Pain in left knee: Secondary | ICD-10-CM | POA: Diagnosis not present

## 2020-05-17 NOTE — Patient Instructions (Addendum)
Thank you for coming in today.  Call or go to the ER if you develop a large red swollen joint with extreme pain or oozing puss.   Please use voltaren gel up to 4x daily for pain as needed.   Sodium Hyaluronate intra-articular injection What is this medicine? SODIUM HYALURONATE (SOE dee um hye al yoor ON ate) is used to treat pain in the knee due to osteoarthritis. This medicine may be used for other purposes; ask your health care provider or pharmacist if you have questions. COMMON BRAND NAME(S): Amvisc, DUROLANE, Euflexxa, GELSYN-3, Hyalgan, Hymovis, Monovisc, Orthovisc, Supartz, Supartz FX, TriVisc, VISCO What should I tell my health care provider before I take this medicine? They need to know if you have any of these conditions:  bleeding disorders  glaucoma  infection in the knee joint  skin conditions or sensitivity  skin infection  an unusual allergic reaction to sodium hyaluronate, other medicines, foods, dyes, or preservatives. Different brands of sodium hyaluronate contain different allergens. Some may contain egg. Talk to your doctor about your allergies to make sure that you get the right product.  pregnant or trying to get pregnant  breast-feeding How should I use this medicine? This medicine is for injection into the knee joint. It is given by a health care professional in a hospital or clinic setting. Talk to your pediatrician regarding the use of this medicine in children. Special care may be needed. Overdosage: If you think you have taken too much of this medicine contact a poison control center or emergency room at once. NOTE: This medicine is only for you. Do not share this medicine with others. What if I miss a dose? This does not apply. What may interact with this medicine? Interactions are not expected. This list may not describe all possible interactions. Give your health care provider a list of all the medicines, herbs, non-prescription drugs, or dietary  supplements you use. Also tell them if you smoke, drink alcohol, or use illegal drugs. Some items may interact with your medicine. What should I watch for while using this medicine? Tell your doctor or healthcare professional if your symptoms do not start to get better or if they get worse. If receiving this medicine for osteoarthritis, limit your activity after you receive your injection. Avoid physical activity for 48 hours following your injection to keep your knee from swelling. Do not stand on your feet for more than 1 hour at a time during the first 48 hours following your injection. Ask your doctor or healthcare professional about when you can begin major physical activity again. What side effects may I notice from receiving this medicine? Side effects that you should report to your doctor or health care professional as soon as possible:  allergic reactions like skin rash, itching or hives, swelling of the face, lips, or tongue  dizziness  facial flushing  pain, tingling, numbness in the hands or feet  vision changes if received this medicine during eye surgery Side effects that usually do not require medical attention (report to your doctor or health care professional if they continue or are bothersome):  back pain  bruising at site where injected  chills  diarrhea  fever  headache  joint pain  joint stiffness  joint swelling  muscle cramps  muscle pain  nausea, vomiting  pain, redness, or irritation at site where injected  weak or tired This list may not describe all possible side effects. Call your doctor for medical advice about side  effects. You may report side effects to FDA at 1-800-FDA-1088. Where should I keep my medicine? This drug is given in a hospital or clinic and will not be stored at home. NOTE: This sheet is a summary. It may not cover all possible information. If you have questions about this medicine, talk to your doctor, pharmacist, or health  care provider.  2021 Elsevier/Gold Standard (2015-03-01 08:34:51)

## 2020-05-28 ENCOUNTER — Telehealth: Payer: Self-pay

## 2020-05-28 NOTE — Telephone Encounter (Signed)
I called [place] back.  Plan to hold on the gel shots for now. Jessica Wallace is not bothered by her pain that much  (as far as we can tell) and it is very expensive for her.   Will revisit in the future.

## 2020-05-28 NOTE — Telephone Encounter (Signed)
Patients daughter called to talk about the Gel injections.  Wanting to know how well the injections work and how long it will last. Informed daughter that it depends on each person and she is well aware that we cannot give a 100% accurate answer on this, she is just curious if it is worth the amount of money. Patient is on a fixed income so the 20% that she in charge of is still a lot of money. Patients daughter would like a call back to discuss the Gel injections and if there is any other options for next steps. Patient had informed the daughter that the cortisone injections "depending on the day you ask" lasted anywhere from a day to a week of relief.

## 2020-06-09 DIAGNOSIS — E782 Mixed hyperlipidemia: Secondary | ICD-10-CM | POA: Diagnosis not present

## 2020-06-09 DIAGNOSIS — N183 Chronic kidney disease, stage 3 unspecified: Secondary | ICD-10-CM | POA: Diagnosis not present

## 2020-06-09 DIAGNOSIS — M1990 Unspecified osteoarthritis, unspecified site: Secondary | ICD-10-CM | POA: Diagnosis not present

## 2020-06-09 DIAGNOSIS — I129 Hypertensive chronic kidney disease with stage 1 through stage 4 chronic kidney disease, or unspecified chronic kidney disease: Secondary | ICD-10-CM | POA: Diagnosis not present

## 2020-07-10 DIAGNOSIS — Z79899 Other long term (current) drug therapy: Secondary | ICD-10-CM | POA: Diagnosis not present

## 2020-07-10 DIAGNOSIS — H30033 Focal chorioretinal inflammation, peripheral, bilateral: Secondary | ICD-10-CM | POA: Diagnosis not present

## 2020-07-10 DIAGNOSIS — H35372 Puckering of macula, left eye: Secondary | ICD-10-CM | POA: Diagnosis not present

## 2020-07-10 DIAGNOSIS — Z961 Presence of intraocular lens: Secondary | ICD-10-CM | POA: Diagnosis not present

## 2020-07-10 DIAGNOSIS — N183 Chronic kidney disease, stage 3 unspecified: Secondary | ICD-10-CM | POA: Diagnosis not present

## 2020-07-10 DIAGNOSIS — I129 Hypertensive chronic kidney disease with stage 1 through stage 4 chronic kidney disease, or unspecified chronic kidney disease: Secondary | ICD-10-CM | POA: Diagnosis not present

## 2020-07-10 DIAGNOSIS — E782 Mixed hyperlipidemia: Secondary | ICD-10-CM | POA: Diagnosis not present

## 2020-07-12 DIAGNOSIS — H30033 Focal chorioretinal inflammation, peripheral, bilateral: Secondary | ICD-10-CM | POA: Diagnosis not present

## 2020-07-12 DIAGNOSIS — Z79899 Other long term (current) drug therapy: Secondary | ICD-10-CM | POA: Diagnosis not present

## 2020-08-09 DIAGNOSIS — I129 Hypertensive chronic kidney disease with stage 1 through stage 4 chronic kidney disease, or unspecified chronic kidney disease: Secondary | ICD-10-CM | POA: Diagnosis not present

## 2020-08-09 DIAGNOSIS — E782 Mixed hyperlipidemia: Secondary | ICD-10-CM | POA: Diagnosis not present

## 2020-08-09 DIAGNOSIS — N183 Chronic kidney disease, stage 3 unspecified: Secondary | ICD-10-CM | POA: Diagnosis not present

## 2020-08-09 DIAGNOSIS — M1990 Unspecified osteoarthritis, unspecified site: Secondary | ICD-10-CM | POA: Diagnosis not present

## 2020-09-09 DIAGNOSIS — N183 Chronic kidney disease, stage 3 unspecified: Secondary | ICD-10-CM | POA: Diagnosis not present

## 2020-09-09 DIAGNOSIS — I129 Hypertensive chronic kidney disease with stage 1 through stage 4 chronic kidney disease, or unspecified chronic kidney disease: Secondary | ICD-10-CM | POA: Diagnosis not present

## 2020-09-09 DIAGNOSIS — E782 Mixed hyperlipidemia: Secondary | ICD-10-CM | POA: Diagnosis not present

## 2020-09-10 DIAGNOSIS — Z7982 Long term (current) use of aspirin: Secondary | ICD-10-CM | POA: Diagnosis not present

## 2020-09-10 DIAGNOSIS — I10 Essential (primary) hypertension: Secondary | ICD-10-CM | POA: Diagnosis not present

## 2020-09-17 DIAGNOSIS — E782 Mixed hyperlipidemia: Secondary | ICD-10-CM | POA: Diagnosis not present

## 2020-09-17 DIAGNOSIS — I10 Essential (primary) hypertension: Secondary | ICD-10-CM | POA: Diagnosis not present

## 2020-09-17 DIAGNOSIS — R7303 Prediabetes: Secondary | ICD-10-CM | POA: Diagnosis not present

## 2020-09-17 DIAGNOSIS — M810 Age-related osteoporosis without current pathological fracture: Secondary | ICD-10-CM | POA: Diagnosis not present

## 2020-09-17 DIAGNOSIS — D849 Immunodeficiency, unspecified: Secondary | ICD-10-CM | POA: Diagnosis not present

## 2020-09-17 DIAGNOSIS — I700 Atherosclerosis of aorta: Secondary | ICD-10-CM | POA: Diagnosis not present

## 2020-09-17 DIAGNOSIS — Z0000 Encounter for general adult medical examination without abnormal findings: Secondary | ICD-10-CM | POA: Diagnosis not present

## 2020-09-17 DIAGNOSIS — W57XXXA Bitten or stung by nonvenomous insect and other nonvenomous arthropods, initial encounter: Secondary | ICD-10-CM | POA: Diagnosis not present

## 2020-09-20 ENCOUNTER — Other Ambulatory Visit: Payer: Self-pay | Admitting: Internal Medicine

## 2020-09-20 DIAGNOSIS — I700 Atherosclerosis of aorta: Secondary | ICD-10-CM

## 2020-10-10 DIAGNOSIS — M1990 Unspecified osteoarthritis, unspecified site: Secondary | ICD-10-CM | POA: Diagnosis not present

## 2020-10-10 DIAGNOSIS — N183 Chronic kidney disease, stage 3 unspecified: Secondary | ICD-10-CM | POA: Diagnosis not present

## 2020-10-10 DIAGNOSIS — I129 Hypertensive chronic kidney disease with stage 1 through stage 4 chronic kidney disease, or unspecified chronic kidney disease: Secondary | ICD-10-CM | POA: Diagnosis not present

## 2020-10-10 DIAGNOSIS — E782 Mixed hyperlipidemia: Secondary | ICD-10-CM | POA: Diagnosis not present

## 2020-10-16 DIAGNOSIS — H30033 Focal chorioretinal inflammation, peripheral, bilateral: Secondary | ICD-10-CM | POA: Diagnosis not present

## 2020-10-16 DIAGNOSIS — Z79899 Other long term (current) drug therapy: Secondary | ICD-10-CM | POA: Diagnosis not present

## 2020-10-16 DIAGNOSIS — Z961 Presence of intraocular lens: Secondary | ICD-10-CM | POA: Diagnosis not present

## 2020-10-16 DIAGNOSIS — H35372 Puckering of macula, left eye: Secondary | ICD-10-CM | POA: Diagnosis not present

## 2020-10-18 DIAGNOSIS — H30033 Focal chorioretinal inflammation, peripheral, bilateral: Secondary | ICD-10-CM | POA: Diagnosis not present

## 2020-10-18 DIAGNOSIS — Z79899 Other long term (current) drug therapy: Secondary | ICD-10-CM | POA: Diagnosis not present

## 2020-11-09 DIAGNOSIS — N183 Chronic kidney disease, stage 3 unspecified: Secondary | ICD-10-CM | POA: Diagnosis not present

## 2020-11-09 DIAGNOSIS — E782 Mixed hyperlipidemia: Secondary | ICD-10-CM | POA: Diagnosis not present

## 2020-11-09 DIAGNOSIS — I129 Hypertensive chronic kidney disease with stage 1 through stage 4 chronic kidney disease, or unspecified chronic kidney disease: Secondary | ICD-10-CM | POA: Diagnosis not present

## 2020-11-09 DIAGNOSIS — M1990 Unspecified osteoarthritis, unspecified site: Secondary | ICD-10-CM | POA: Diagnosis not present

## 2020-12-10 DIAGNOSIS — I129 Hypertensive chronic kidney disease with stage 1 through stage 4 chronic kidney disease, or unspecified chronic kidney disease: Secondary | ICD-10-CM | POA: Diagnosis not present

## 2020-12-10 DIAGNOSIS — N183 Chronic kidney disease, stage 3 unspecified: Secondary | ICD-10-CM | POA: Diagnosis not present

## 2020-12-10 DIAGNOSIS — M1990 Unspecified osteoarthritis, unspecified site: Secondary | ICD-10-CM | POA: Diagnosis not present

## 2020-12-10 DIAGNOSIS — E782 Mixed hyperlipidemia: Secondary | ICD-10-CM | POA: Diagnosis not present

## 2020-12-14 DIAGNOSIS — E782 Mixed hyperlipidemia: Secondary | ICD-10-CM | POA: Diagnosis not present

## 2020-12-31 DIAGNOSIS — M25562 Pain in left knee: Secondary | ICD-10-CM | POA: Diagnosis not present

## 2020-12-31 DIAGNOSIS — Z23 Encounter for immunization: Secondary | ICD-10-CM | POA: Diagnosis not present

## 2020-12-31 DIAGNOSIS — I10 Essential (primary) hypertension: Secondary | ICD-10-CM | POA: Diagnosis not present

## 2021-01-09 DIAGNOSIS — I129 Hypertensive chronic kidney disease with stage 1 through stage 4 chronic kidney disease, or unspecified chronic kidney disease: Secondary | ICD-10-CM | POA: Diagnosis not present

## 2021-01-09 DIAGNOSIS — N183 Chronic kidney disease, stage 3 unspecified: Secondary | ICD-10-CM | POA: Diagnosis not present

## 2021-01-09 DIAGNOSIS — E782 Mixed hyperlipidemia: Secondary | ICD-10-CM | POA: Diagnosis not present

## 2021-01-09 DIAGNOSIS — M1990 Unspecified osteoarthritis, unspecified site: Secondary | ICD-10-CM | POA: Diagnosis not present

## 2021-01-22 DIAGNOSIS — H35372 Puckering of macula, left eye: Secondary | ICD-10-CM | POA: Diagnosis not present

## 2021-01-22 DIAGNOSIS — H30033 Focal chorioretinal inflammation, peripheral, bilateral: Secondary | ICD-10-CM | POA: Diagnosis not present

## 2021-01-22 DIAGNOSIS — Z79899 Other long term (current) drug therapy: Secondary | ICD-10-CM | POA: Diagnosis not present

## 2021-01-22 DIAGNOSIS — Z961 Presence of intraocular lens: Secondary | ICD-10-CM | POA: Diagnosis not present

## 2021-02-06 DIAGNOSIS — Z79899 Other long term (current) drug therapy: Secondary | ICD-10-CM | POA: Diagnosis not present

## 2021-02-06 DIAGNOSIS — R7303 Prediabetes: Secondary | ICD-10-CM | POA: Diagnosis not present

## 2021-02-06 DIAGNOSIS — I10 Essential (primary) hypertension: Secondary | ICD-10-CM | POA: Diagnosis not present

## 2021-02-06 DIAGNOSIS — R413 Other amnesia: Secondary | ICD-10-CM | POA: Diagnosis not present

## 2021-02-07 ENCOUNTER — Other Ambulatory Visit: Payer: Self-pay | Admitting: Internal Medicine

## 2021-02-07 DIAGNOSIS — R413 Other amnesia: Secondary | ICD-10-CM

## 2021-02-27 ENCOUNTER — Other Ambulatory Visit: Payer: Self-pay

## 2021-02-27 ENCOUNTER — Ambulatory Visit
Admission: RE | Admit: 2021-02-27 | Discharge: 2021-02-27 | Disposition: A | Discharge status: Home / Self Care | Payer: Medicare Other | Source: Ambulatory Visit | Attending: Internal Medicine | Admitting: Internal Medicine

## 2021-02-27 DIAGNOSIS — R413 Other amnesia: Secondary | ICD-10-CM | POA: Diagnosis not present

## 2021-02-27 DIAGNOSIS — R9082 White matter disease, unspecified: Secondary | ICD-10-CM | POA: Diagnosis not present

## 2021-02-27 DIAGNOSIS — I6782 Cerebral ischemia: Secondary | ICD-10-CM | POA: Diagnosis not present

## 2021-02-27 DIAGNOSIS — I6381 Other cerebral infarction due to occlusion or stenosis of small artery: Secondary | ICD-10-CM | POA: Diagnosis not present

## 2021-03-13 DIAGNOSIS — R7303 Prediabetes: Secondary | ICD-10-CM | POA: Diagnosis not present

## 2021-03-13 DIAGNOSIS — R413 Other amnesia: Secondary | ICD-10-CM | POA: Diagnosis not present

## 2021-03-21 ENCOUNTER — Encounter: Payer: Self-pay | Admitting: *Deleted

## 2021-03-25 ENCOUNTER — Other Ambulatory Visit: Payer: Self-pay

## 2021-03-25 ENCOUNTER — Encounter: Payer: Self-pay | Admitting: Psychiatry

## 2021-03-25 ENCOUNTER — Ambulatory Visit: Payer: Medicare Other | Admitting: Psychiatry

## 2021-03-25 VITALS — BP 129/72 | HR 75 | Ht 62.0 in | Wt 147.0 lb

## 2021-03-25 DIAGNOSIS — F03A0 Unspecified dementia, mild, without behavioral disturbance, psychotic disturbance, mood disturbance, and anxiety: Secondary | ICD-10-CM

## 2021-03-25 NOTE — Patient Instructions (Signed)
° °  Tasks to improve attention/working memory 1. Good sleep hygiene (7-8 hrs of sleep) 2. Learning a new skill (Painting, Carpentry, Pottery, new language, Knitting). 3.Cognitive exercises (keep a daily journal, Puzzles) 4. Physical exercise and training  (30 min/day X 4 days week) 5. Being on Antidepressant if needed 6.Yoga, Meditation, Tai Chi 7. Decrease alcohol intake 8.Have a clear schedule and structure in daily routine  

## 2021-03-25 NOTE — Progress Notes (Signed)
[facility]  PATIENT: Jessica Wallace DOB: April 22, 1945  REFERRING CLINICIAN: Deland Pretty, MD HISTORY FROM: self, daughter REASON FOR VISIT: memory loss   HISTORICAL  CHIEF COMPLAINT:  Chief Complaint  Patient presents with   Dementia    Rm 1 with daughter [place] Pt is well, daughter states she has noticed  a dramatic decline in pt's memory about 6 months ago. She forgets short term things and is very repetitive.  Pt does not think she has had any major memory changes.     HISTORY OF PRESENT ILLNESS:  The patient presents for evaluation of memory loss which has been present over the past 2 years but has declined significantly in the past 6 months.Patient has noticed occasional forgetfulness, but it is mostly family members and friends who have become concerned for cognitive changes. Remote memory is intact but she struggles with her short term memory. She will forget conversations that happened earlier that day. Repeats herself often and will ask the same question multiple times in a conversation. Struggles to keep track of the day of the week.   She was started on Namenda one month ago. This was just increased to 10 mg BID last week. Thinks there has been possibly mild improvement since increasing the dose.  Notes that she drinks a shot of bourbon every night.  TBI:  No past history of TBI Stroke: MRI brain with remote lacunar stroke within right centrum semiovale. She is taking ASA 81 mg and lipitor 20 mg daily Seizures:  no past history of seizures Sleep: Sleeps well at night. Thinks she does occasionally snore. Patient denies daytime fatigue, but daughter states she makes comments during the day that she is tired Mood: Patient denies anxiety or depression. Daughter feels like she worries excessively  Functional status: Patient lives by herself Cooking: Cooks by herself, denies issues. Daughter is concerned that she is not eating and she is having  trouble finding things around the house Cleaning: No issues cleaning Shopping: no issues Driving: only drives to grocery store and house, has not gotten lost or had a car accident Bills: Balances her checkbook and pays all her bills on time. Has accidentally paid the rent twice on a few occasions Medications: Occasionally misses her pills, uses a pill organizer which helps Forgetting loved ones names?: no issues Word finding difficulty? Yes, mild  OTHER MEDICAL CONDITIONS: HTN, HLD, inflammatory eye disease   REVIEW OF SYSTEMS: Full 14 system review of systems performed and negative with exception of: memory loss  ALLERGIES: Allergies  Allergen Reactions   Indomethacin Diarrhea and Nausea And Vomiting   Ketoconazole     Other reaction(s): skin irritation    HOME MEDICATIONS: Outpatient Medications Prior to Visit  Medication Sig Dispense Refill   amLODipine (NORVASC) 5 MG tablet Take 5 mg by mouth daily.     aspirin 81 MG tablet Take 81 mg by mouth daily.     atorvastatin (LIPITOR) 20 MG tablet Take 20 mg by mouth daily.     bisoprolol (ZEBETA) 5 MG tablet Take 5 mg by mouth daily.     Cholecalciferol 50 MCG (2000 UT) CAPS Take by mouth.     dorzolamide-timolol (COSOPT) 22.3-6.8 MG/ML ophthalmic solution 1 drop once.     telmisartan (MICARDIS) 80 MG tablet Take 80 mg by mouth daily.     fluorometholone (FML) 0.1 % ophthalmic suspension 1 drop every 4 (four) hours. (Patient not taking: Reported on 03/25/2021)     hydrocortisone cream 1 %  Apply 1 application topically 2 (two) times daily. (Patient not taking: Reported on 03/25/2021)     mupirocin cream (BACTROBAN) 2 % Apply 1 application topically 3 (three) times daily. (Patient not taking: Reported on 03/25/2021) 15 g 0   No facility-administered medications prior to visit.    PAST MEDICAL HISTORY: Past Medical History:  Diagnosis Date   Aortic atherosclerosis ([place])    CKD (chronic kidney disease), stage III (HCC)    Gout     hx of   Hyperlipidemia    Hypertension    Memory loss    Osteoarthritis    Psoriasis    Sciatica of left side     PAST SURGICAL HISTORY: Past Surgical History:  Procedure Laterality Date   APPENDECTOMY      FAMILY HISTORY: History reviewed. No pertinent family history.  SOCIAL HISTORY: Social History   Socioeconomic History   Marital status: Divorced    Spouse name: Not on file   Number of children: 1   Years of education: Not on file   Highest education level: Not on file  Occupational History   Not on file  Tobacco Use   Smoking status: Never   Smokeless tobacco: Never  Substance and Sexual Activity   Alcohol use: Yes    Comment: 1-2 x week   Drug use: Never   Sexual activity: Not on file  Other Topics Concern   Not on file  Social History Narrative   Lives with dog   Social Determinants of Health   Financial Resource Strain: Not on file  Food Insecurity: Not on file  Transportation Needs: Not on file  Physical Activity: Not on file  Stress: Not on file  Social Connections: Not on file  Intimate Partner Violence: Not on file     PHYSICAL EXAM  GENERAL EXAM/CONSTITUTIONAL: Vitals:  Vitals:   03/25/21 1353  BP: 129/72  Pulse: 75  Weight: 147 lb (66.7 kg)  Height: 5\' 2"  (1.575 m)   Body mass index is 26.89 kg/m. Wt Readings from Last 3 Encounters:  03/25/21 147 lb (66.7 kg)  05/17/20 163 lb 3.2 oz (74 kg)  03/12/20 165 lb 9.6 oz (75.1 kg)   Patient is in no distress; well developed, nourished and groomed; neck is supple  CARDIOVASCULAR: Examination of peripheral vascular system by observation and palpation is normal  EYES: Pupils round and reactive to light, Visual fields full to confrontation, Extraocular movements intact  MUSCULOSKELETAL: Gait, strength, tone, movements noted in Neurologic exam below  NEUROLOGIC: MENTAL STATUS:  MMSE - [place] Exam 03/25/2021  Orientation to time 3  Orientation to Place 4  Registration  3  Attention/ Calculation 3  Recall 0  Language- name 2 objects 2  Language- repeat 1  Language- follow 3 step command 3  Language- read & follow direction 1  Write a sentence 1  Copy design 0  Total score 21    CRANIAL NERVE:  2nd, 3rd, 4th, 6th - pupils equal and reactive to light, visual fields full to confrontation, extraocular muscles intact, no nystagmus 5th - facial sensation symmetric 7th - facial strength symmetric 8th - hearing intact 9th - palate elevates symmetrically, uvula midline 11th - shoulder shrug symmetric 12th - tongue protrusion midline  MOTOR:  normal bulk and tone, no cogwheeling, full strength in the BUE, BLE  SENSORY:  normal and symmetric to light touch all 4 extremities  COORDINATION:  finger-nose-finger, fine finger movements normal, no tremor  REFLEXES:  deep tendon reflexes  present and symmetric  GAIT/STATION:  normal     DIAGNOSTIC DATA (LABS, IMAGING, TESTING) - I reviewed patient records, labs, notes, testing and imaging myself where available.  A1c 5.5 TSH 0.971 B12 399 LDL 106  MRI brain 02/27/21: chronic lacunar infarct in right centrum semiovale, mild white matter changes, normal cerebral volume for age   ASSESSMENT AND PLAN  76 y.o. year old female with a history of HTN, HLD, inflammatory eye disease who presents for evaluation of memory loss which began 2 years ago but significantly worsened over the past 6 months. MRI brain with small vessel ischemic changes and a right lacunar infarct. MMSE 21/30. She maintains most of her independence but ADLs including paying bills and managing medications are slightly impacted. Clinical picture is most consistent with a mild dementia, possibly vascular in nature given MRI findings and stepwise decline 6 months ago. Discussed importance of controlling vascular risk factors and taking medications as directed. Will continue Namenda at current dose for now as it was just recently increased.  Family does not report safety concerns at this time.   No diagnosis found.    PLAN: - Continue Namenda 20 mg daily  - Discussed general brain health measures including importance of physical and cognitive exercise. Discussed limiting alcohol use - Packet of dementia resources in [facility] provided - next steps: consider donepezil if no improvement with Namenda   Return in about 3 months (around 06/22/2021).  I spent an average of 48 minutes chart reviewing and counseling the patient, with at least 50% of the time face to face with the patient. General brain health measures discussed, including the importance of regular aerobic exercise. Reviewed safety measures including driving safety.   Genia Harold, MD 03/25/21 2:51 PM  [facility] [address] [place], [place] [address] [phone]

## 2021-04-09 DIAGNOSIS — N183 Chronic kidney disease, stage 3 unspecified: Secondary | ICD-10-CM | POA: Diagnosis not present

## 2021-04-09 DIAGNOSIS — I129 Hypertensive chronic kidney disease with stage 1 through stage 4 chronic kidney disease, or unspecified chronic kidney disease: Secondary | ICD-10-CM | POA: Diagnosis not present

## 2021-04-09 DIAGNOSIS — E782 Mixed hyperlipidemia: Secondary | ICD-10-CM | POA: Diagnosis not present

## 2021-04-09 DIAGNOSIS — M1990 Unspecified osteoarthritis, unspecified site: Secondary | ICD-10-CM | POA: Diagnosis not present

## 2021-05-06 DIAGNOSIS — R413 Other amnesia: Secondary | ICD-10-CM | POA: Diagnosis not present

## 2021-05-06 DIAGNOSIS — R5383 Other fatigue: Secondary | ICD-10-CM | POA: Diagnosis not present

## 2021-05-09 ENCOUNTER — Telehealth: Payer: Self-pay

## 2021-05-09 NOTE — Telephone Encounter (Signed)
Spoke with patient's daughter [place] and scheduled a Mychart Palliative Consult for 05/13/21 @ 2 PM. ? ?Consent obtained; updated Netsmart, Team List and Epic.  ? ?

## 2021-05-13 ENCOUNTER — Encounter: Payer: Self-pay | Admitting: Family Medicine

## 2021-05-13 ENCOUNTER — Telehealth: Payer: Medicare Other | Admitting: Family Medicine

## 2021-05-13 DIAGNOSIS — R413 Other amnesia: Secondary | ICD-10-CM | POA: Diagnosis not present

## 2021-05-13 DIAGNOSIS — Z8673 Personal history of transient ischemic attack (TIA), and cerebral infarction without residual deficits: Secondary | ICD-10-CM | POA: Diagnosis not present

## 2021-05-13 DIAGNOSIS — Z515 Encounter for palliative care: Secondary | ICD-10-CM | POA: Diagnosis not present

## 2021-05-13 HISTORY — DX: Personal history of transient ischemic attack (TIA), and cerebral infarction without residual deficits: Z86.73

## 2021-05-13 NOTE — Progress Notes (Signed)
? ? ?Manufacturing engineer ?Community Palliative Care Consult Note ?Telephone: [phone]  ?Fax: [phone]  ? ?Date of encounter: 05/13/21 ?3:42 PM ?PATIENT NAME: Maurissa Ambrose ?[place] [address]   ?[phone] (home)  ?DOB: Jul 11, 1945 ?MRN: [id number] ?PRIMARY CARE PROVIDER:    ?Deland Pretty, MD,  ?[place] [address] ?[phone] ? ?REFERRING PROVIDER:   ?Deland Pretty, MD ?[place],  [place] [address] ?[phone] ? ?RESPONSIBLE PARTY:    ?Contact Information   ? ? Name Relation Home Work Mobile  ? Spelman,Victoria Daughter   [phone]  ? ?  ?I connected with  Trinna Balloon on 05/13/21 by a video enabled telemedicine application and verified that I am speaking with the correct person using two identifiers. ?  ?I discussed the limitations of evaluation and management by telemedicine. The patient expressed understanding and agreed to proceed.  ? ? Spoke with patient and her daughter Loletha Carrow.  Palliative Care was asked to follow this patient by consultation request of  Deland Pretty, MD to address advance care planning and complex medical decision making. This is the initial visit.  ? ? ?      ASSESSMENT, SYMPTOM MANAGEMENT AND PLAN / RECOMMENDATIONS:  ? Palliative Care Encounter ?SW referral to address daycare program, part time in home help/cleaning assistance and reminding pt of evening meal prep. ? ? Memory loss ?MRI 02/27/21: Chronic lacunar infarct within the right centrum semiovale. ?Background mild cerebral white matter chronic small vessel ischemic disease. ?Continue Memantine. ? ? ?Follow up Palliative Care Visit: Palliative care will continue to follow for complex medical decision making, advance care planning, and clarification of goals. Return 4 weeks or prn. ? ? ? ?This visit was coded based on medical decision making (MDM). ? ?PPS: 70% ? ?HOSPICE ELIGIBILITY/DIAGNOSIS: TBD ? ?Chief Complaint:   ?[facility] received a referral to follow up with patient for chronic disease management of dementia.  Follow up is also for advance directive planning and defining/refining goals of care. ? ?HISTORY OF PRESENT ILLNESS:  Chamille Werntz is a 76 y.o. year old female with diagnoses of dementia, hypertension, gout, atherosclerosis, bilateral cataracts, osteoarthritis, osteoporosis, CKD stage 3, chronic pain, memory loss, hyperlipidemia, hypertensive retinopathy and psoriasis. Pt's daughter indicates a need to help patient with remembering to eat meals and once or twice weekly to help with light housekeeping.  She states having looked into the day program at [facility] but states cost is prohibitive and wanted to know if there were any resources to cover or help with costs of program or if there are other similar programs in the community that are similar but of lower cost that pt may be eligible to participate. Pt denies any difficulty with appetite, falls or bowel/bladder incontinence.  Independent in self care and lives alone currently.  Has support from her daughter.   ?Discussed helping with goals of care, MOST form according to pt's wishes and daughter states "I don't think we are there yet.  She is doing well, independent and we want to keep her in her own home like this for as long as possible."  Advised that MOST is something that hospitals address on admission and can be changed as condition changes if she wants all care now but later may want to decline some things.  Advised that this is not mandatory but food for thought.   ?On 01/22/21 CMP normal with Albumin 3.8, except elevated BUN 29 and slightly  low WBC 4.3 with mild decrease in lymphocyte count. Daughter states pt doesn't drink much water. ? ?History obtained from review of EMR, discussion with with family and/or Ms. Ozawa.  ?I reviewed available labs, medications, imaging, studies and related documents from the EMR.   Records reviewed and summarized above.  ? ?ROS ?General: NAD ?Cardiovascular: denies chest pain, denies DOE ?Pulmonary: denies cough, denies increased SOB ?Abdomen: endorses good appetite with occasional forgetting dinner and continence of bowel ?GU: endorses continence of urine ?MSK:  denies increased weakness or falls  ?Neurological: denies pain, denies insomnia ?Psych: Endorses positive mood, memory loss ? ? ?Physical Exam: limited to observation only ?Current and past weights: last weight 147 lbs on 03/25/21 ?Constitutional: NAD ?General: WNWD ?EYES: anicteric sclera, lids intact ?ENMT: intact hearing, oral mucous membranes moist,  ?CV: color good without cyanosis, able to speak in complete sentences. ?Pulmonary: No visible increased work of breathing, no cough or wheezing audible ?MSK: moves all extremities ?Neuro:  alert and oriented ?Psych: blunted affect ? ? ?CURRENT PROBLEM LIST:  ?Patient Active Problem List  ? Diagnosis Date Noted  ? Age-related osteoporosis without current pathological fracture 01/31/2020  ? Body mass index (BMI) 30.0-30.9, adult 01/31/2020  ? Chronic pain 01/31/2020  ? Essential hypertension 01/31/2020  ? Hardening of the aorta (main artery of the heart) ([place]) 01/31/2020  ? History of appendectomy 01/31/2020  ? History of gout 01/31/2020  ? Immunodeficiency disorder ([place]) 01/31/2020  ? Iritis 01/31/2020  ? Long term (current) use of aspirin 01/31/2020  ? Memory loss 01/31/2020  ? Midline cystocele 01/31/2020  ? Mixed hyperlipidemia 01/31/2020  ? Osteoarthritis 01/31/2020  ? Psoriasis 01/31/2020  ? Stage 3 chronic kidney disease ([place]) 01/31/2020  ? Hypertensive retinopathy of both eyes 11/04/2016  ? Nuclear sclerotic cataract of both eyes 11/04/2016  ? ?PAST MEDICAL HISTORY:  ?Active Ambulatory Problems  ?  Diagnosis Date Noted  ? Age-related osteoporosis without current pathological fracture 01/31/2020  ? Body mass index (BMI) 30.0-30.9, adult 01/31/2020  ? Chronic pain 01/31/2020  ?  Essential hypertension 01/31/2020  ? Hardening of the aorta (main artery of the heart) ([place]) 01/31/2020  ? History of appendectomy 01/31/2020  ? History of gout 01/31/2020  ? Hypertensive retinopathy of both eyes 11/04/2016  ? Immunodeficiency disorder ([place]) 01/31/2020  ? Iritis 01/31/2020  ? Long term (current) use of aspirin 01/31/2020  ? Memory loss 01/31/2020  ? Midline cystocele 01/31/2020  ? Mixed hyperlipidemia 01/31/2020  ? Nuclear sclerotic cataract of both eyes 11/04/2016  ? Osteoarthritis 01/31/2020  ? Psoriasis 01/31/2020  ? Stage 3 chronic kidney disease ([place]) 01/31/2020  ? ?Resolved Ambulatory Problems  ?  Diagnosis Date Noted  ? No Resolved Ambulatory Problems  ? ?Past Medical History:  ?Diagnosis Date  ? Aortic atherosclerosis ([place])   ? CKD (chronic kidney disease), stage III ([place])   ? Gout   ? Hyperlipidemia   ? Hypertension   ? Sciatica of left side   ? ?SOCIAL HX:  ?Social History  ? ?Tobacco Use  ? Smoking status: Never  ? Smokeless tobacco: Never  ?Substance Use Topics  ? Alcohol use: Yes  ?  Comment: 1-2 x week  ? ?FAMILY HX: History reviewed. No pertinent family history.   ? ? ?Preferred Pharmacy: ?ALLERGIES:  ?Allergies  ?Allergen Reactions  ? Indomethacin Diarrhea and Nausea And Vomiting  ? Ketoconazole   ?  Other reaction(s): skin irritation  ?   ?PERTINENT MEDICATIONS:  ?Outpatient Encounter Medications as of 05/13/2021  ?Medication  Sig  ? amLODipine (NORVASC) 5 MG tablet Take 5 mg by mouth daily.  ? aspirin 81 MG tablet Take 81 mg by mouth daily.  ? atorvastatin (LIPITOR) 20 MG tablet Take 20 mg by mouth daily.  ? bisoprolol (ZEBETA) 5 MG tablet Take 5 mg by mouth daily.  ? Cholecalciferol 50 MCG (2000 UT) CAPS Take by mouth.  ? dorzolamide-timolol (COSOPT) 22.3-6.8 MG/ML ophthalmic solution 1 drop once.  ? telmisartan (MICARDIS) 80 MG tablet Take 80 mg by mouth daily.  ? ?No facility-administered encounter medications on file as of 05/13/2021.   ? ? ? ?---------------------------------------------------------------------------------------------------------------------------------------------------------------- ?Advance Care Planning/Goals of Care: Goals include to maximize quality of life and sympto

## 2021-06-09 DIAGNOSIS — M1990 Unspecified osteoarthritis, unspecified site: Secondary | ICD-10-CM | POA: Diagnosis not present

## 2021-06-09 DIAGNOSIS — N183 Chronic kidney disease, stage 3 unspecified: Secondary | ICD-10-CM | POA: Diagnosis not present

## 2021-06-09 DIAGNOSIS — E782 Mixed hyperlipidemia: Secondary | ICD-10-CM | POA: Diagnosis not present

## 2021-06-09 DIAGNOSIS — I129 Hypertensive chronic kidney disease with stage 1 through stage 4 chronic kidney disease, or unspecified chronic kidney disease: Secondary | ICD-10-CM | POA: Diagnosis not present

## 2021-06-10 ENCOUNTER — Other Ambulatory Visit: Payer: Medicare Other | Admitting: Family Medicine

## 2021-06-10 ENCOUNTER — Encounter: Payer: Self-pay | Admitting: Family Medicine

## 2021-06-10 VITALS — BP 130/60 | HR 69 | Resp 16

## 2021-06-10 DIAGNOSIS — Z789 Other specified health status: Secondary | ICD-10-CM

## 2021-06-10 DIAGNOSIS — Z515 Encounter for palliative care: Secondary | ICD-10-CM

## 2021-06-10 NOTE — Progress Notes (Signed)
? ? ?Manufacturing engineer ?Community Palliative Care Consult Note ?Telephone: [phone]  ?Fax: [phone]  ? ? ?Date of encounter: 06/10/21 ?11:55 AM ?PATIENT NAME: Jessica Wallace ?[place] [address]   ?[phone] (home)  ?DOB: 09-19-1945 ?MRN: [id number] ?PRIMARY CARE PROVIDER:    ?Deland Pretty, MD,  ?[place] [address] ?[phone] ? ?REFERRING PROVIDER:   ?Deland Pretty, MD ?[place],  [place] [address] ?[phone] ? ?RESPONSIBLE PARTY:    ?Contact Information   ? ? Name Relation Home Work Mobile  ? Saenz,Victoria Daughter   [phone]  ? ?  ? ? ? ?I met face to face with patient and daughter, Herbert Spires in patient's home.  Palliative Care was asked to follow this patient by consultation request of  Deland Pretty, MD to address advance care planning and complex medical decision making. This is a follow up visit. ? ?                                 ASSESSMENT, SYMPTOM MANAGEMENT AND PLAN / RECOMMENDATIONS:  ? Needs Assistance with Community Resources ?SW referral for in home personal care services assistance and light meal prep along with community resource for possible adult daycare and costs. ? ? Palliative Care Encounter ?Encouraged designation of HC POA and MOST for goals of care to align with pt's wishes. ? ?Advance Care Planning/Goals of Care: Goals include to maximize quality of life and symptom management. Patient/health care surrogate gave her permission to discuss. ?Our advance care planning conversation included a discussion about:    ?The value and importance of advance care planning  ?Exploration of goals of care in the event of a sudden injury or illness and while pt possesses judgement and insight to express her wishes ?Identification of a healthcare agent  ?Review of a MOST form ? ?CODE STATUS: ?Currently full code ? ? ? ?Follow up Palliative Care Visit: Palliative care will continue to follow for  complex medical decision making, advance care planning, and clarification of goals. Return 6 weeks or prn. ? ? ? ?This visit was coded based on medical decision making (MDM). ? ?PPS: 80% ? ?HOSPICE ELIGIBILITY/DIAGNOSIS: TBD ? ?Chief Complaint:  ?Palliative Care is following patient for chronic medical management in setting of dementia and for follow up of advance care planning and goals of care. ? ?HISTORY OF PRESENT ILLNESS:  Jessica Wallace is a 76 y.o. year old female with dementia, HTN, aortic atherosclerosis, osteoporosis, osteoarthritis, psoriasis, CKD stage 3 and midline cystocele, HLD, gout, hypertensive retinopathy, hx of lacunar CVA and immunodeficiency disorder.  Per daughter Herbert Spires the patient is not eating well and many times has not thawed out something to cook for dinner.  Usually has toast and coffee or cereal and coffee for breakfast. Pt has difficulty with reaching some areas of her tub to clean and states to daughter she changes sheets once weekly but daughter is unsure since pt does not usually know the day of the week.  Pt has not fallen. No difficulty with swallowing.  No CP, SOB, dysuria, constipation.  Has pill packs for meds through [facility].  Sleeping well and mood is self reported as good.  States she watches tv or works in her yard to entertain herself.  Daughter is looking for someone to assist 1-2 days per week with light cleaning and helping with dinner meal prep.  She states  pt has gotten meals on wheels but hasn't started yet.  She previously looked into the [facility] program but was too cost prohibitive.  Both would like for pt to remain in her own home and independent for as long as possible.  Brought up Advance Care Planning and daughter states, "I don't think we are there yet."  Discussed Advance Care Planning importance while pt has intact insight and judgement to give informed consent for what she would or would not wish to be done for her.  Left copy of MOST form  with pt and daughter, encouraged to discuss and let this provider know if they were interested in completing it.  ? ?History obtained from review of EMR, discussion with family, and/or Ms. Krupp.  ?I reviewed available labs, medications, imaging, studies and related documents from the EMR.  Records reviewed and summarized above.  ? ?ROS ?General: NAD ?EYES: denies vision changes ?ENMT: denies dysphagia ?Cardiovascular: denies chest pain, denies DOE ?Pulmonary: denies cough, denies increased SOB ?Abdomen: endorses "good" appetite, denies constipation, endorses continence of bowel ?GU: denies dysuria, endorses continence of urine ?MSK:  denies increased weakness, no falls reported ?Skin: denies rashes or wounds ?Neurological: denies pain, denies insomnia ?Psych: Endorses positive mood ?Heme/lymph/immuno: denies bruises, abnormal bleeding ? ?Physical Exam: ?Current and past weights:  Current weight 136 lbs on home scale today, last weight 147 lbs on 03/25/21 ?Constitutional: NAD ?General: WNWD ?EYES: anicteric sclera, lids intact, no discharge  ?ENMT: intact hearing, oral mucous membranes moist, dentition intact ?CV: S1S2, RRR, no LE edema ?Pulmonary: CTAB, no increased work of breathing, no cough, room air ?Abdomen: normo-active BS + 4 quadrants, soft and non tender, no ascites ?GU: deferred ?MSK: no sarcopenia, moves all extremities, ambulatory ?Skin: warm and dry, no rashes or wounds on visible skin ?Neuro:  no generalized weakness, some pleasant confusion ?Psych: non-anxious affect, A and O x 2 ?Hem/lymph/immuno: no widespread bruising ? ? ?Thank you for the opportunity to participate in the care of Ms. Gerwig.  The palliative care team will continue to follow. Please call our office at [phone] if we can be of additional assistance.  ? ?Marijo Conception, FNP -C ? ?COVID-19 PATIENT SCREENING TOOL ?Asked and negative response unless otherwise noted:  ? ?Have you had symptoms of covid, tested positive or  been in contact with someone with symptoms/positive test in the past 5-10 days?  No ? ?

## 2021-06-15 ENCOUNTER — Encounter: Payer: Self-pay | Admitting: Family Medicine

## 2021-06-15 NOTE — Progress Notes (Signed)
This appointment was canceled. Jessica Hippo FNP-C ?

## 2021-06-17 ENCOUNTER — Other Ambulatory Visit: Payer: Medicare Other

## 2021-06-17 DIAGNOSIS — Z515 Encounter for palliative care: Secondary | ICD-10-CM

## 2021-06-17 NOTE — Progress Notes (Signed)
COMMUNITY PALLIATIVE CARE SW NOTE ? ?PATIENT NAME: Jessica Wallace ?DOB: [birth year] ?MRN: [id number] ? ?PRIMARY CARE PROVIDER: Deland Pretty, MD ? ?RESPONSIBLE PARTY:  ?Acct ID - Guarantor Home Phone Work Phone Relationship Acct Type  ?[id number] - [name],L* [phone]  Self P/F  ?   [address], [place], [place] [address]  ? ?SOCIAL WORK TELEPHONIC ENCOUNTER ? ?PC SW completed telephonic encounter with patient's daughter-Jessica Wallace to provide education and support. [place] advised that she needed more help around the house with her mother. SW provided education about [facility]. SW provided resources to her for in-home care to include agencies and private caregivers. SW discussed the pros and cons of each resource. SW encouraged [place] to reach out to the resources provided to her. SW extended ongoing support to her and encouraged her to call with any additional questions or concerns. [place] remains open to ongoing support.  ? ?Katheren Puller, LCSW ? ?

## 2021-07-03 ENCOUNTER — Ambulatory Visit: Payer: Medicare Other | Admitting: Psychiatry

## 2021-07-03 ENCOUNTER — Telehealth: Payer: Self-pay | Admitting: *Deleted

## 2021-07-03 ENCOUNTER — Encounter: Payer: Self-pay | Admitting: Psychiatry

## 2021-07-03 VITALS — BP 131/67 | HR 65 | Ht 62.0 in | Wt 143.2 lb

## 2021-07-03 DIAGNOSIS — F01B0 Vascular dementia, moderate, without behavioral disturbance, psychotic disturbance, mood disturbance, and anxiety: Secondary | ICD-10-CM

## 2021-07-03 MED ORDER — MEMANTINE HCL 10 MG PO TABS: 10.0000 mg | ORAL_TABLET | Freq: Two times a day (BID) | ORAL | 11 refills | Status: DC

## 2021-07-03 MED ORDER — DONEPEZIL HCL 5 MG PO TABS: ORAL_TABLET | ORAL | 6 refills | Status: DC

## 2021-07-03 NOTE — Telephone Encounter (Signed)
Aricept PA, Key: [id number] This medication or product is on your plan's list of covered drugs. Prior authorization is not required at this time. If your pharmacy has questions regarding the processing of your prescription, please have them call the [facility] pharmacy help desk at [phone].  Faxed this information to [facility].

## 2021-07-03 NOTE — Progress Notes (Signed)
   CC:  memory loss  Follow-up Visit  Last visit: 03/25/21  Brief HPI: 76 year old female with a history of HTN, HLD, inflammatory eye disease who follows in clinic for memory loss. MRI brain 02/27/21: chronic lacunar infarct in right centrum semiovale, mild white matter changes, normal cerebral volume for age  At her last visit Namenda had recently been increased to 10 mg BID. This was continued at the current dose to give more time to take effect.  Interval History: Memory seems worse since her last visit. She recently had a car battery die and completely forgot about it 2 days later. She will forget conversations she had earlier that morning.  She continues to live alone. Family visits her frequently and her neighbor helps out as well. Family is working on getting home health care set up for her. She has been losing weight and daughter remains concern that she is not eating. She is working on getting meals on wheels set up.   Family is working on finding a Chief Executive Officer to get POA paperwork set up.  Physical Exam:   Vital Signs: BP 131/67   Pulse 65   Ht '5\' 2"'$  (1.575 m)   Wt 143 lb 3.2 oz (65 kg)   BMI 26.19 kg/m  GENERAL:  well appearing, in no acute distress, alert  SKIN:  Color, texture, turgor normal. No rashes or lesions HEAD:  Normocephalic/atraumatic. RESP: normal respiratory effort MSK:  No gross joint deformities.   NEUROLOGICAL:    07/03/2021    3:25 PM 03/25/2021    2:01 PM  MMSE - Mini Mental State Exam  Orientation to time 3 3  Orientation to Place 3 4  Registration 3 3  Attention/ Calculation 0 3  Recall 1 0  Language- name 2 objects 2 2  Language- repeat 1 1  Language- follow 3 step command 3 3  Language- read & follow direction 1 1  Write a sentence 1 1  Copy design 0 0  Total score 18 21    Cranial Nerves: PERRL, face symmetric, no dysarthria, hearing grossly intact Motor: moves all extremities equally Gait: normal-based.  IMPRESSION: 76 year old  female with a history of HTN, HLD, inflammatory eye disease who presents for follow up of dementia. Her memory has worsened since her last visit, though she remains independent with most of her ADLs. Will start Donepezil. Discussed safety measures with family including having someone check in with patient daily and supervised meals when possible.  PLAN: -Start donepezil 5 mg daily x4 weeks, then increase to 10 mg daily -Continue Namenda 10 mg BID  Follow-up: 6 months  I spent a total of 28 minutes on the date of the service. Discussed medication side effects, adverse reactions and drug interactions. Written educational materials and patient instructions outlining all of the above were given.  Genia Harold, MD 07/03/21 4:01 PM

## 2021-07-03 NOTE — Patient Instructions (Addendum)
Donepezil We recommended that you take Donepezil (Aricept) '5mg'$  tab once a day for the first 4 weeks, then increase the dose to '10mg'$  once per day. It is better to take Donepezil with breakfast or lunch. Aricept is well tolerated, although some people may experience nausea or diarrhea. These side effects were usually mild and temporary. If symptoms continue or become severe, you should stop the medication and contact us.

## 2021-07-04 ENCOUNTER — Other Ambulatory Visit: Payer: Self-pay | Admitting: Psychiatry

## 2021-07-04 ENCOUNTER — Telehealth: Payer: Self-pay | Admitting: Psychiatry

## 2021-07-04 MED ORDER — DONEPEZIL HCL 10 MG PO TABS: 10.0000 mg | ORAL_TABLET | Freq: Every day | ORAL | 6 refills | Status: DC

## 2021-07-04 NOTE — Telephone Encounter (Signed)
Received call from Frost, with [facility] who stated insurance will not pay for two 5 mg tablets daily for 30 days once patient gets to that dose. I advised her to fill 5 mg, 30 tabs for 30 days x 4 weeks.  A new Rx can then be sent for 10 mg tabs, take one tab daily. She asked if MD would go ahead and send in new Rx and they will place it on hold. I advised her I'll send to MD. Radonna Ricker verbalized understanding, appreciation.

## 2021-07-04 NOTE — Telephone Encounter (Signed)
I sent in an Rx for the 10 mg tablets, thanks

## 2021-07-11 DIAGNOSIS — I10 Essential (primary) hypertension: Secondary | ICD-10-CM | POA: Diagnosis not present

## 2021-07-11 DIAGNOSIS — R413 Other amnesia: Secondary | ICD-10-CM | POA: Diagnosis not present

## 2021-07-11 DIAGNOSIS — Z1211 Encounter for screening for malignant neoplasm of colon: Secondary | ICD-10-CM | POA: Diagnosis not present

## 2021-07-15 ENCOUNTER — Telehealth: Payer: Self-pay

## 2021-07-15 NOTE — Telephone Encounter (Signed)
Volunteer check in call made for palliative care, no answer 

## 2021-07-22 ENCOUNTER — Other Ambulatory Visit: Payer: Medicare Other | Admitting: Family Medicine

## 2021-07-22 ENCOUNTER — Encounter: Payer: Self-pay | Admitting: Family Medicine

## 2021-07-22 VITALS — BP 132/76 | HR 68 | Resp 16

## 2021-07-22 DIAGNOSIS — F01B0 Vascular dementia, moderate, without behavioral disturbance, psychotic disturbance, mood disturbance, and anxiety: Secondary | ICD-10-CM | POA: Insufficient documentation

## 2021-07-22 DIAGNOSIS — Z789 Other specified health status: Secondary | ICD-10-CM

## 2021-07-22 DIAGNOSIS — R634 Abnormal weight loss: Secondary | ICD-10-CM

## 2021-07-22 NOTE — Progress Notes (Signed)
[facility] Consult Note Telephone: [phone]  Fax: [phone]    Date of encounter: 07/22/21 3:46 PM PATIENT NAME: Jessica Wallace [address] [place] [address]   [phone] (home)  DOB: Oct 21, 1945 MRN: [id number] PRIMARY CARE PROVIDER:    Deland Pretty, MD,  [address] [place] [address] [phone]  REFERRING PROVIDER:   Deland Wallace, [place],  [place] [address] [phone]  RESPONSIBLE PARTY:    Contact Information     Name Relation Home Work [place] Daughter   [phone]        I met face to face with patient and daughter Jessica Wallace in patient's  home. Palliative Care was asked to follow this patient by consultation request of  Jessica Pretty, MD to address advance care planning and complex medical decision making. This is a follow up visit.  ASSESSMENT, SYMPTOM MANAGEMENT AND PLAN / RECOMMENDATIONS:  Dementia without behavioral disturbance Slight worsening Maintaining a routine with familiarity helps decrease anxiety and improve possibility of routine function. MMSE per Neurologist decreased from 21 to 18/3 months Plan to discuss ACP at next visit  2.  Inadequate community resource/Weight loss unintentional 17 lb weight loss in 10 months Paid caregiver resource provided by SW is present to help with meal prep 2 days per week and reminding pt to eat Daughter plans on contacting for meals on wheels. Continue to follow and may need to add protein supplement.  CODE STATUS: Full code at present    Follow up Palliative Care Visit: Palliative care will continue to follow for complex medical decision making, advance care planning, and clarification of goals. Return 6 weeks or prn.    This visit was coded based on medical decision making (MDM).  PPS: 80%  HOSPICE ELIGIBILITY/DIAGNOSIS: TBD  Chief Complaint:   Palliative  Care is following for chronic medical management in setting of dementia.    HISTORY OF PRESENT ILLNESS:  Jessica Wallace is a 76 y.o. year old female with dementia, HTN, osteoporosis, osteoarthritis, Stage 3 CKD, hx of psoriasis and gout, immunodeficiency disorder, hypertensive retinopathy of both eyes, and hx of CVA. Daughter Jessica Wallace states that SW was able to provide a list of paid caregivers and she was able to find one who didn't require 12 hours a week but will come in for 2 hours on Wed and Thurs, named Jessica Wallace to help with household chores and to ensure that pt is starting dinner preparations.  Pt does not think she needs this but was very accepting of the help.  Daughter stated previously that she would be talking with pt and she would not have pulled out anything to fix for dinner and wasn't eating routinely.  Currently her appetite is good, she has had no falls and no significant c/o pain or SOB. Likes to garden and spend some time outside. She was recently seen and underwent MMSE testing by Neurology on 07/03/21 with decrease in score from 21 in February 2023 to 18 in May. Donepezil was added 5 mg daily x 4 weeks then increase to 10 mg daily, continue Namenda 10 mg BID. Seen by PCP 07/11/21 with weight showing 157 lbs 09/17/20, 141 lbs current visit.    History obtained from review of EMR, discussion with daughter Jessica Wallace and/or Jessica Wallace.  I reviewed available labs, medications, imaging, studies and related documents from the EMR.  Records reviewed and summarized above.   ROS General: NAD  ENMT: denies dysphagia Cardiovascular: denies chest pain, denies DOE Pulmonary: denies cough, denies increased SOB Abdomen: endorses good appetite, denies constipation, endorses continence of bowel GU: endorses continence of urine MSK:  denies increased weakness,  no falls reported   Physical Exam: Current and past weights: lost 17 lbs in 10 months Constitutional: NAD General: WNWD  ENMT: intact  hearing CV: S1S2, RRR, trace pedal edema right foot 1+ left foot Pulmonary: CTAB, no increased work of breathing, no cough, room air Abdomen:  normo-active BS + 4 quadrants, soft and non tender, no ascites MSK: moves all extremities, ambulatory Skin: warm and dry, no rashes or wounds on visible skin Neuro: noted short term memory deficit Psych: non-anxious affect, A and O x 3   Thank you for the opportunity to participate in the care of Jessica Wallace.  The palliative care team will continue to follow. Please call our office at [phone] if we can be of additional assistance.   Jessica Conception, FNP   COVID-19 PATIENT SCREENING TOOL Asked and negative response unless otherwise noted:   Have you had symptoms of covid, tested positive or been in contact with someone with symptoms/positive test in the past 5-10 days?  No

## 2021-07-23 NOTE — Telephone Encounter (Signed)
Error

## 2021-08-30 DIAGNOSIS — Z961 Presence of intraocular lens: Secondary | ICD-10-CM | POA: Diagnosis not present

## 2021-08-30 DIAGNOSIS — H30033 Focal chorioretinal inflammation, peripheral, bilateral: Secondary | ICD-10-CM | POA: Diagnosis not present

## 2021-08-30 DIAGNOSIS — H35372 Puckering of macula, left eye: Secondary | ICD-10-CM | POA: Diagnosis not present

## 2021-08-30 DIAGNOSIS — Z79899 Other long term (current) drug therapy: Secondary | ICD-10-CM | POA: Diagnosis not present

## 2021-09-02 ENCOUNTER — Encounter: Payer: Self-pay | Admitting: Family Medicine

## 2021-09-02 ENCOUNTER — Other Ambulatory Visit: Payer: Medicare Other | Admitting: Family Medicine

## 2021-09-02 VITALS — BP 128/68 | HR 59 | Resp 16 | Wt 131.0 lb

## 2021-09-02 DIAGNOSIS — F01B0 Vascular dementia, moderate, without behavioral disturbance, psychotic disturbance, mood disturbance, and anxiety: Secondary | ICD-10-CM

## 2021-09-02 DIAGNOSIS — R634 Abnormal weight loss: Secondary | ICD-10-CM

## 2021-09-02 NOTE — Progress Notes (Signed)
[facility] Consult Note Telephone: [phone]  Fax: [phone]    Date of encounter: 09/02/21 3:48 PM PATIENT NAME: Jessica Wallace [place] [address]   [phone] (home)  DOB: 1945-09-25 MRN: [id number] PRIMARY CARE PROVIDER:    Deland Pretty, MD,  [address] [place] [address] [phone]  REFERRING PROVIDER:   Deland Wallace, [place],  [place] [address] [phone]  RESPONSIBLE PARTY:    Contact Information     Name Relation Home Work [place] Daughter   [phone]        I met face to face with patient in patient's  home. Palliative Care was asked to follow this patient by consultation request of  Jessica Pretty, MD to address advance care planning and complex medical decision making. This is a follow up visit.  ASSESSMENT, SYMPTOM MANAGEMENT AND PLAN / RECOMMENDATIONS:  Dementia without behavioral disturbance Slight worsening FAST 7 Score 4  2.  Weight loss unintentional 19 lb weight loss in 7 months Has private caregiver coming in to assist with meals a couple of days per week Encouraged to add protein supplement (even small quantities like Ensure or greek yogurt high in protein to prevent further weight loss  CODE STATUS: Full code at present    Follow up Palliative Care Visit: Palliative care will continue to follow for complex medical decision making, advance care planning, and clarification of goals. Return 8 weeks or prn.    This visit was coded based on medical decision making (MDM).  PPS: 80%  HOSPICE ELIGIBILITY/DIAGNOSIS: TBD  Chief Complaint:   Palliative Care is following for chronic medical management in setting of dementia.    HISTORY OF PRESENT ILLNESS:  Jessica Wallace is a 76 y.o. year old female with dementia, HTN, osteoporosis, osteoarthritis, Stage 3 CKD, hx of psoriasis and gout,  immunodeficiency disorder, hypertensive retinopathy of both eyes, and hx of CVA. Currently her appetite is good, denies falls, pain or SOB.  Donepezil 10 mg daily, continues on Namenda 10 mg BID. No issue with dysuria or constipation.  Eats breakfast and dinner, no lunch.  Has private caregiver that comes in once a week to help.    History obtained from review of EMR, discussion with daughter Jessica Wallace and/or Jessica Wallace.  I reviewed available labs, medications, imaging, studies and related documents from the EMR.  Records reviewed and summarized above.   ROS General: NAD ENMT: denies dysphagia Cardiovascular: denies chest pain, denies DOE Pulmonary: denies cough, denies increased SOB Abdomen: endorses good appetite, denies constipation, endorses continence of bowel GU: endorses continence of urine MSK:  denies increased weakness,  no falls reported   Physical Exam: Current and past weights: 131 lbs on home scale today, weight 02/06/21 was 150 lbs Constitutional: NAD General: WN, WD  ENMT: intact hearing CV: S1S2, RRR, no edema Pulmonary: CTAB, no increased work of breathing, no cough, room air Abdomen:  normo-active BS + 4 quadrants, soft and non tender MSK: moves all extremities, ambulatory Skin: warm and dry, no rashes or wounds on visible skin Neuro: noted short term memory deficit Psych: non-anxious affect, A and O x 3   Thank you for the opportunity to participate in the care of Jessica Wallace.  The palliative care team will continue to follow. Please call our office at [phone] if we can be of additional assistance.   Jessica Conception, FNP -C  COVID-19 PATIENT  SCREENING TOOL Asked and negative response unless otherwise noted:   Have you had symptoms of covid, tested positive or been in contact with someone with symptoms/positive test in the past 5-10 days?  No

## 2021-09-18 DIAGNOSIS — H30033 Focal chorioretinal inflammation, peripheral, bilateral: Secondary | ICD-10-CM | POA: Diagnosis not present

## 2021-09-18 DIAGNOSIS — Z79899 Other long term (current) drug therapy: Secondary | ICD-10-CM | POA: Diagnosis not present

## 2021-09-19 DIAGNOSIS — E782 Mixed hyperlipidemia: Secondary | ICD-10-CM | POA: Diagnosis not present

## 2021-09-19 DIAGNOSIS — I10 Essential (primary) hypertension: Secondary | ICD-10-CM | POA: Diagnosis not present

## 2021-09-19 DIAGNOSIS — R7303 Prediabetes: Secondary | ICD-10-CM | POA: Diagnosis not present

## 2021-09-24 DIAGNOSIS — M810 Age-related osteoporosis without current pathological fracture: Secondary | ICD-10-CM | POA: Diagnosis not present

## 2021-09-24 DIAGNOSIS — Z23 Encounter for immunization: Secondary | ICD-10-CM | POA: Diagnosis not present

## 2021-12-04 DIAGNOSIS — Z23 Encounter for immunization: Secondary | ICD-10-CM | POA: Diagnosis not present

## 2021-12-04 DIAGNOSIS — M810 Age-related osteoporosis without current pathological fracture: Secondary | ICD-10-CM | POA: Diagnosis not present

## 2021-12-04 DIAGNOSIS — N1831 Chronic kidney disease, stage 3a: Secondary | ICD-10-CM | POA: Diagnosis not present

## 2021-12-04 DIAGNOSIS — F01B0 Vascular dementia, moderate, without behavioral disturbance, psychotic disturbance, mood disturbance, and anxiety: Secondary | ICD-10-CM | POA: Diagnosis not present

## 2021-12-04 DIAGNOSIS — D849 Immunodeficiency, unspecified: Secondary | ICD-10-CM | POA: Diagnosis not present

## 2021-12-04 DIAGNOSIS — I10 Essential (primary) hypertension: Secondary | ICD-10-CM | POA: Diagnosis not present

## 2021-12-04 DIAGNOSIS — L409 Psoriasis, unspecified: Secondary | ICD-10-CM | POA: Diagnosis not present

## 2021-12-04 DIAGNOSIS — Z0000 Encounter for general adult medical examination without abnormal findings: Secondary | ICD-10-CM | POA: Diagnosis not present

## 2021-12-04 DIAGNOSIS — I700 Atherosclerosis of aorta: Secondary | ICD-10-CM | POA: Diagnosis not present

## 2021-12-04 DIAGNOSIS — Z8673 Personal history of transient ischemic attack (TIA), and cerebral infarction without residual deficits: Secondary | ICD-10-CM | POA: Diagnosis not present

## 2021-12-19 ENCOUNTER — Other Ambulatory Visit: Payer: Self-pay | Admitting: *Deleted

## 2021-12-19 DIAGNOSIS — F01B0 Vascular dementia, moderate, without behavioral disturbance, psychotic disturbance, mood disturbance, and anxiety: Secondary | ICD-10-CM

## 2021-12-19 MED ORDER — DONEPEZIL HCL 10 MG PO TABS: 10.0000 mg | ORAL_TABLET | Freq: Every day | ORAL | 6 refills | Status: DC

## 2022-01-14 ENCOUNTER — Ambulatory Visit: Payer: Medicare Other | Admitting: Psychiatry

## 2022-01-14 VITALS — BP 100/71 | HR 69 | Ht 63.0 in | Wt 130.0 lb

## 2022-01-14 DIAGNOSIS — F01A0 Vascular dementia, mild, without behavioral disturbance, psychotic disturbance, mood disturbance, and anxiety: Secondary | ICD-10-CM | POA: Diagnosis not present

## 2022-01-14 NOTE — Progress Notes (Signed)
   CC:  memory loss  Follow-up Visit  Last visit: 07/03/21  Brief HPI: 76 year old female with a history of HTN, HLD, inflammatory eye disease who follows in clinic for memory loss. MRI brain 02/27/21: chronic lacunar infarct in right centrum semiovale, mild white matter changes, normal cerebral volume for age.  At her last visit, donepezil was added to [place].  Interval History: Since her last visit she feels her memory has been relatively stable.  However daughter feels there has been a rapid decline over the past 6 months. Family has had to take over her grocery shopping as she has not been eating. Told her family she was having cereal for breakfast, but when family checked her kitchen she did not own any cereal. Family is helping with her finances. She is continuing to manage her own medications, which come pre-packaged by the pharmacy. She continues to live alone and family or home health care will visit her every day except for Friday.   She continues to take Namenda 10 mg BID and donepezil 10 mg daily without issues.  Physical Exam:   Vital Signs: BP 100/71   Pulse 69   Ht '5\' 3"'$  (1.6 m)   Wt 130 lb (59 kg)   BMI 23.03 kg/m  GENERAL:  well appearing, in no acute distress, alert  SKIN:  Color, texture, turgor normal. No rashes or lesions HEAD:  Normocephalic/atraumatic. RESP: normal respiratory effort MSK:  No gross joint deformities.   NEUROLOGICAL: Mental Status:     01/14/2022    3:24 PM 07/03/2021    3:25 PM 03/25/2021    2:01 PM  MMSE - Mini Mental State Exam  Orientation to time '2 3 3  '$ Orientation to Place '4 3 4  '$ Registration '3 3 3  '$ Attention/ Calculation 5 0 3  Recall 0 1 0  Language- name 2 objects '2 2 2  '$ Language- repeat '1 1 1  '$ Language- follow 3 step command '3 3 3  '$ Language- read & follow direction '1 1 1  '$ Write a sentence '1 1 1  '$ Copy design 1 0 0  Total score '23 18 21   '$ Cranial Nerves: PERRL, face symmetric, no dysarthria, hearing grossly intact Motor:  moves all extremities equally Gait: normal-based.  IMPRESSION: 76 year old female with a history of HTN, HLD, inflammatory eye disease who presents for follow up of dementia. MMSE today is improved from her last visit, however her family has continued to notice a decline in her memory and her functioning. She does repeat herself very frequently during this visit. Will continue Donepezil and Namenda for her memory. Discussed living arrangements and family is concerned about her safety at home. They would like her to move into assisted living, but are concerned about affordability. Referral to Social Work placed to help identify community resources that may be available to her.  PLAN: -Continue donepezil 10 mg daily, Namenda 10 mg BID -Referral to Social Work -Next steps: consider mirtazapine for appetite    Follow-up: 6 months  I spent a total of 45 minutes on the date of the service. Discussed medication side effects, adverse reactions and drug interactions. Written educational materials and patient instructions outlining all of the above were given.  Genia Harold, MD 01/14/22 4:12 PM

## 2022-01-16 ENCOUNTER — Ambulatory Visit: Payer: Medicare Other | Admitting: Psychiatry

## 2022-01-20 ENCOUNTER — Telehealth: Payer: Self-pay | Admitting: Psychiatry

## 2022-01-20 NOTE — Telephone Encounter (Signed)
Referral for Social Work sent to [facility]. Phone: [phone], Fax: [phone]

## 2022-01-21 DIAGNOSIS — F02B0 Dementia in other diseases classified elsewhere, moderate, without behavioral disturbance, psychotic disturbance, mood disturbance, and anxiety: Secondary | ICD-10-CM | POA: Diagnosis not present

## 2022-01-21 DIAGNOSIS — H30033 Focal chorioretinal inflammation, peripheral, bilateral: Secondary | ICD-10-CM | POA: Diagnosis not present

## 2022-01-21 DIAGNOSIS — Z961 Presence of intraocular lens: Secondary | ICD-10-CM | POA: Diagnosis not present

## 2022-01-21 DIAGNOSIS — Z79899 Other long term (current) drug therapy: Secondary | ICD-10-CM | POA: Diagnosis not present

## 2022-01-21 DIAGNOSIS — G301 Alzheimer's disease with late onset: Secondary | ICD-10-CM | POA: Diagnosis not present

## 2022-01-21 DIAGNOSIS — H35372 Puckering of macula, left eye: Secondary | ICD-10-CM | POA: Diagnosis not present

## 2022-06-10 DIAGNOSIS — F01B0 Vascular dementia, moderate, without behavioral disturbance, psychotic disturbance, mood disturbance, and anxiety: Secondary | ICD-10-CM | POA: Diagnosis not present

## 2022-06-10 DIAGNOSIS — I10 Essential (primary) hypertension: Secondary | ICD-10-CM | POA: Diagnosis not present

## 2022-06-15 ENCOUNTER — Other Ambulatory Visit: Payer: Self-pay | Admitting: Psychiatry

## 2022-06-17 NOTE — Telephone Encounter (Signed)
Last seen on 01/14/22 per note "Continue donepezil 10 mg daily, Namenda 10 mg BID" Follow up scheduled on 07/29/22 Last filled 05/26/22 #60 tablets (30 day supply)  Note attached to Rx refill from pharmacy "This prescription was filled on 05/26/2022. Any refills authorized will be placed on file."

## 2022-07-22 ENCOUNTER — Other Ambulatory Visit: Payer: Self-pay | Admitting: Psychiatry

## 2022-07-22 ENCOUNTER — Other Ambulatory Visit: Payer: Self-pay

## 2022-07-23 ENCOUNTER — Other Ambulatory Visit: Payer: Self-pay

## 2022-07-28 NOTE — Progress Notes (Deleted)
No chief complaint on file.   HISTORY OF PRESENT ILLNESS:  07/28/22 ALL:  Jessica Wallace is a 77 y.o. female here today for follow up for dementia. She was last seen by Dr Delena Bali 01/2022. MMSE 23/30. She was living alone and there was some concern that she was forgetting to eat. Referral was placed to social work to assist family with resources for assisted living facilities. She continued donepezil 10mg  every day and memantine 10mg  BID. Since,    HISTORY (copied from Dr Quentin Mulling previous note)  77 year old female with a history of HTN, HLD, inflammatory eye disease who follows in clinic for memory loss. MRI brain 02/27/21: chronic lacunar infarct in right centrum semiovale, mild white matter changes, normal cerebral volume for age.   At her last visit, donepezil was added to Namenda.   Interval History: Since her last visit she feels her memory has been relatively stable.  However daughter feels there has been a rapid decline over the past 6 months. Family has had to take over her grocery shopping as she has not been eating. Told her family she was having cereal for breakfast, but when family checked her kitchen she did not own any cereal. Family is helping with her finances. She is continuing to manage her own medications, which come pre-packaged by the pharmacy. She continues to live alone and family or home health care will visit her every day except for Friday.    She continues to take Namenda 10 mg BID and donepezil 10 mg daily without issues.   REVIEW OF SYSTEMS: Out of a complete 14 system review of symptoms, the patient complains only of the following symptoms, and all other reviewed systems are negative.   ALLERGIES: Allergies  Allergen Reactions   Indomethacin Diarrhea and Nausea And Vomiting   Ketoconazole     Other reaction(s): skin irritation     HOME MEDICATIONS: Outpatient Medications Prior to Visit  Medication Sig Dispense Refill   amLODipine (NORVASC) 5 MG  tablet Take 5 mg by mouth daily.     aspirin 81 MG tablet Take 81 mg by mouth daily.     atorvastatin (LIPITOR) 20 MG tablet Take 20 mg by mouth daily.     bisoprolol (ZEBETA) 5 MG tablet Take 5 mg by mouth daily.     Cholecalciferol 50 MCG (2000 UT) CAPS Take by mouth.     donepezil (ARICEPT) 10 MG tablet Take 1 tablet (10 mg total) by mouth daily. 30 tablet 6   folic acid (FOLVITE) 1 MG tablet Take 4 mg by mouth daily.     memantine (NAMENDA) 10 MG tablet TAKE TWO TABLETS BY MOUTH EVERY MORNING 60 tablet 0   telmisartan (MICARDIS) 80 MG tablet Take 80 mg by mouth daily.     No facility-administered medications prior to visit.     PAST MEDICAL HISTORY: Past Medical History:  Diagnosis Date   Aortic atherosclerosis (HCC)    Chronic pain 01/31/2020   CKD (chronic kidney disease), stage III (HCC)    Gout    hx of   History of lacunar cerebrovascular accident (CVA) 05/13/2021   Hyperlipidemia    Hypertension    Memory loss    Osteoarthritis    Psoriasis    Sciatica of left side      PAST SURGICAL HISTORY: Past Surgical History:  Procedure Laterality Date   APPENDECTOMY       FAMILY HISTORY: No family history on file.   SOCIAL HISTORY: Social  History   Socioeconomic History   Marital status: Divorced    Spouse name: Not on file   Number of children: 1   Years of education: Not on file   Highest education level: High school graduate  Occupational History   Not on file  Tobacco Use   Smoking status: Never   Smokeless tobacco: Never  Substance and Sexual Activity   Alcohol use: Yes    Comment: 1-2 x week   Drug use: Never   Sexual activity: Not on file  Other Topics Concern   Not on file  Social History Narrative   07/03/21 Lives alone   Social Determinants of Health   Financial Resource Strain: Not on file  Food Insecurity: Not on file  Transportation Needs: Not on file  Physical Activity: Not on file  Stress: Not on file  Social Connections: Not on  file  Intimate Partner Violence: Not on file     PHYSICAL EXAM  There were no vitals filed for this visit. There is no height or weight on file to calculate BMI.  Generalized: Well developed, in no acute distress  Cardiology: normal rate and rhythm, no murmur auscultated  Respiratory: clear to auscultation bilaterally    Neurological examination  Mentation: Alert oriented to time, place, history taking. Follows all commands speech and language fluent Cranial nerve II-XII: Pupils were equal round reactive to light. Extraocular movements were full, visual field were full on confrontational test. Facial sensation and strength were normal. Uvula tongue midline. Head turning and shoulder shrug  were normal and symmetric. Motor: The motor testing reveals 5 over 5 strength of all 4 extremities. Good symmetric motor tone is noted throughout.  Sensory: Sensory testing is intact to soft touch on all 4 extremities. No evidence of extinction is noted.  Coordination: Cerebellar testing reveals good finger-nose-finger and heel-to-shin bilaterally.  Gait and station: Gait is normal. Tandem gait is normal. Romberg is negative. No drift is seen.  Reflexes: Deep tendon reflexes are symmetric and normal bilaterally.    DIAGNOSTIC DATA (LABS, IMAGING, TESTING) - I reviewed patient records, labs, notes, testing and imaging myself where available.  No results found for: "WBC", "HGB", "HCT", "MCV", "PLT" No results found for: "NA", "K", "CL", "CO2", "GLUCOSE", "BUN", "CREATININE", "CALCIUM", "PROT", "ALBUMIN", "AST", "ALT", "ALKPHOS", "BILITOT", "GFRNONAA", "GFRAA" No results found for: "CHOL", "HDL", "LDLCALC", "LDLDIRECT", "TRIG", "CHOLHDL" No results found for: "HGBA1C" No results found for: "VITAMINB12" No results found for: "TSH"     01/14/2022    3:24 PM 07/03/2021    3:25 PM 03/25/2021    2:01 PM  MMSE - Mini Mental State Exam  Orientation to time 2 3 3   Orientation to Place 4 3 4    Registration 3 3 3   Attention/ Calculation 5 0 3  Recall 0 1 0  Language- name 2 objects 2 2 2   Language- repeat 1 1 1   Language- follow 3 step command 3 3 3   Language- read & follow direction 1 1 1   Write a sentence 1 1 1   Copy design 1 0 0  Total score 23 18 21          No data to display           ASSESSMENT AND PLAN  77 y.o. year old female  has a past medical history of Aortic atherosclerosis (HCC), Chronic pain (01/31/2020), CKD (chronic kidney disease), stage III (HCC), Gout, History of lacunar cerebrovascular accident (CVA) (05/13/2021), Hyperlipidemia, Hypertension, Memory loss, Osteoarthritis, Psoriasis, and Sciatica  of left side. here with    No diagnosis found.  [place] ***.  Healthy lifestyle habits encouraged. *** will follow up with PCP as directed. *** will return to see me in ***, sooner if needed. *** verbalizes understanding and agreement with this plan.   No orders of the defined types were placed in this encounter.    No orders of the defined types were placed in this encounter.    Shawnie Dapper, MSN, FNP-C 07/28/2022, 9:39 AM  [facility] [address], [address] [place] [employer] [place], [place] [address] [phone]

## 2022-07-29 ENCOUNTER — Telehealth: Payer: Self-pay | Admitting: Psychiatry

## 2022-07-29 ENCOUNTER — Ambulatory Visit: Payer: Medicare Other | Admitting: Family Medicine

## 2022-07-29 DIAGNOSIS — F01A0 Vascular dementia, mild, without behavioral disturbance, psychotic disturbance, mood disturbance, and anxiety: Secondary | ICD-10-CM

## 2022-07-29 DIAGNOSIS — F01B0 Vascular dementia, moderate, without behavioral disturbance, psychotic disturbance, mood disturbance, and anxiety: Secondary | ICD-10-CM

## 2022-07-29 NOTE — Telephone Encounter (Signed)
Pt's daughter has called upset that the appointment that was set for today has been cancelled by pt.  Daughter is asking if there is a way that no request of pt are processed (example: cancelling of appointments)

## 2022-07-29 NOTE — Telephone Encounter (Addendum)
Pt scheduled on 01/07/23 with Dr.Chima.

## 2022-08-20 ENCOUNTER — Other Ambulatory Visit: Payer: Self-pay | Admitting: Psychiatry

## 2022-09-25 ENCOUNTER — Other Ambulatory Visit: Payer: Self-pay | Admitting: Psychiatry

## 2022-09-30 DIAGNOSIS — Z79899 Other long term (current) drug therapy: Secondary | ICD-10-CM | POA: Diagnosis not present

## 2022-09-30 DIAGNOSIS — Z961 Presence of intraocular lens: Secondary | ICD-10-CM | POA: Diagnosis not present

## 2022-09-30 DIAGNOSIS — H30033 Focal chorioretinal inflammation, peripheral, bilateral: Secondary | ICD-10-CM | POA: Diagnosis not present

## 2022-09-30 DIAGNOSIS — G301 Alzheimer's disease with late onset: Secondary | ICD-10-CM | POA: Diagnosis not present

## 2022-09-30 DIAGNOSIS — H35372 Puckering of macula, left eye: Secondary | ICD-10-CM | POA: Diagnosis not present

## 2022-10-01 IMAGING — MR MR HEAD W/O CM
10 series · 48 of 48 positions shown · non-contrast
Comparison: No pertinent prior exams available for comparison.

CLINICAL DATA: Provided history: Memory loss.

EXAM:
MRI HEAD WITHOUT CONTRAST
TECHNIQUE: Multiplanar, multiecho pulse sequences of the brain and surrounding
structures were obtained without intravenous contrast.

[Series 2: T1 · sagittal · 5.0mm · 0.45mm/px · 2 of 21 slices shown]
[im 1/21]
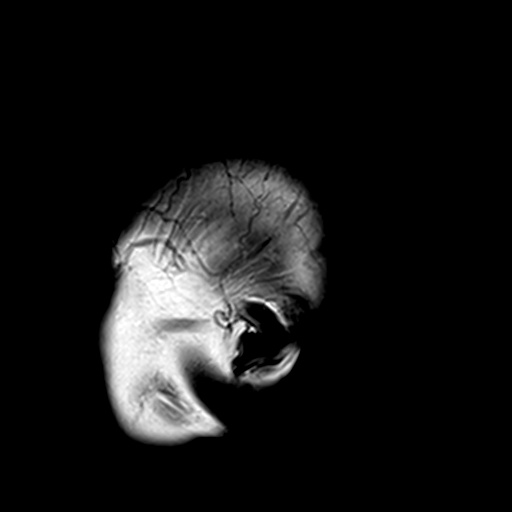
[im 21/21]
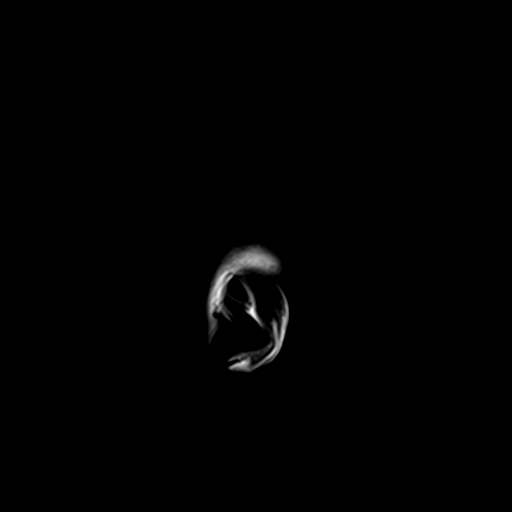

[Series 3: DWI · axial · 3.0mm · 1.80mm/px · z∈[-86,+60]mm · 9 of 100 slices shown (1 of 4)]
[im 1/100]
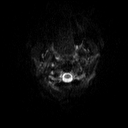
[im 13/100]
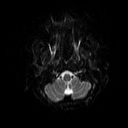
[im 25/100]
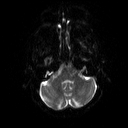
[im 38/100]
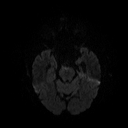
[im 50/100]
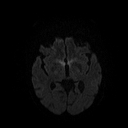
[im 62/100]
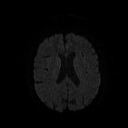
[im 75/100]
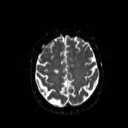
[im 87/100]
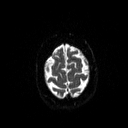
[im 100/100]
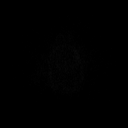

[Series 4: DWI · axial · 3.0mm · 1.80mm/px · z∈[-86,+60]mm · 5 of 50 slices shown (2 of 4)]
[im 1/50]
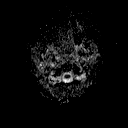
[im 13/50]
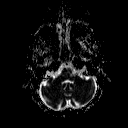
[im 25/50]
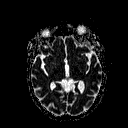
[im 37/50]
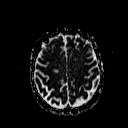
[im 50/50]
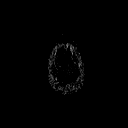

[Series 5: DWI · coronal · 5.0mm · 1.80mm/px · 6 of 65 slices shown (3 of 4)]
[im 1/65]
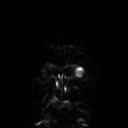
[im 13/65]
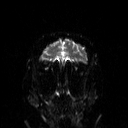
[im 26/65]
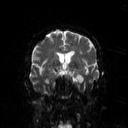
[im 39/65]
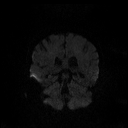
[im 52/65]
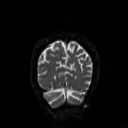
[im 65/65]
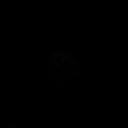

[Series 6: DWI · coronal · 5.0mm · 1.80mm/px · 3 of 34 slices shown (4 of 4)]
[im 1/34]
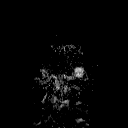
[im 17/34]
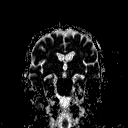
[im 34/34]
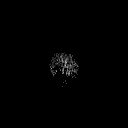

[Series 7: T2 · axial · 5.0mm · 0.51mm/px · z∈[-85,+61]mm · 2 of 22 slices shown (1 of 2)]
[im 1/22]
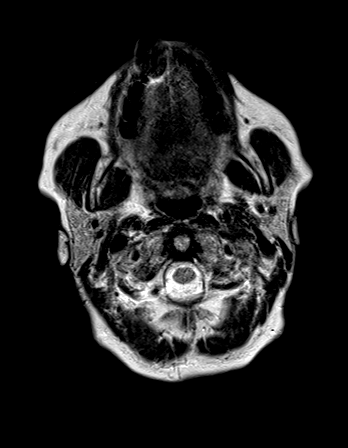
[im 22/22]
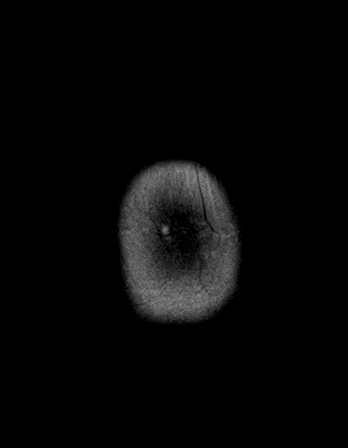

[Series 8: FLAIR · axial · 3.0mm · 0.45mm/px · z∈[-78,+65]mm · 3 of 32 slices shown]
[im 1/32]
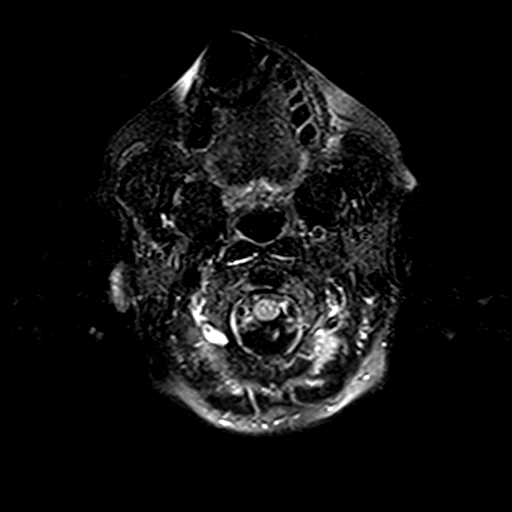
[im 16/32]
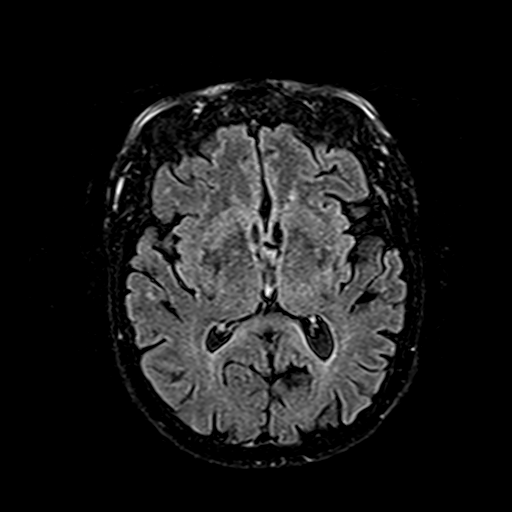
[im 32/32]
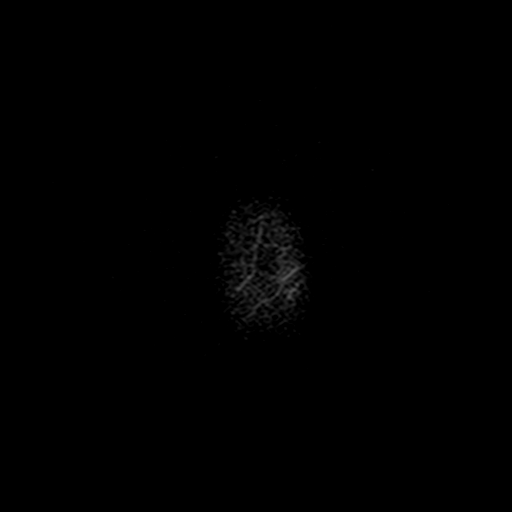

[Series 10: swi_images · axial · 4.0mm · 0.90mm/px · z∈[-78,+61]mm · 3 of 36 slices shown]
[im 1/36]
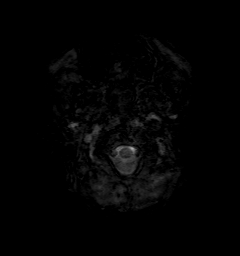
[im 18/36]
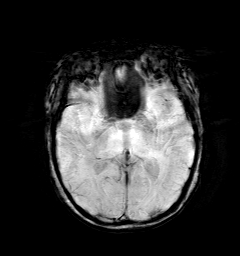
[im 36/36]
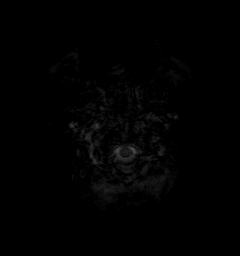

[Series 11: t1_mpr_tra · axial · 1.0mm · 0.71mm/px · z∈[-74,+68]mm · 13 of 144 slices shown]
[im 1/144]
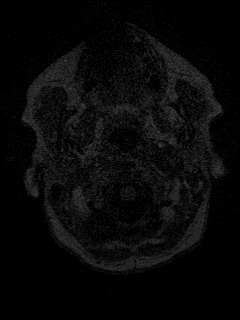
[im 12/144]
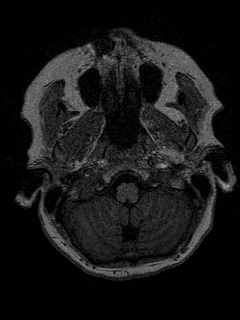
[im 24/144]
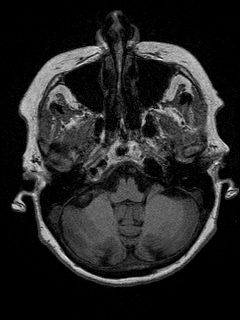
[im 36/144]
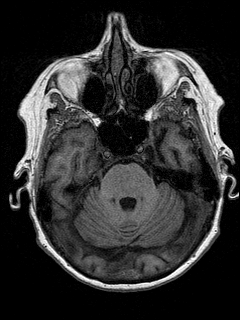
[im 48/144]
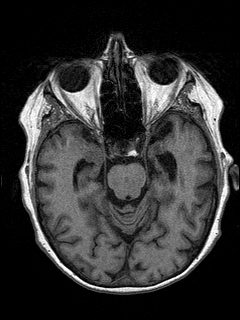
[im 60/144]
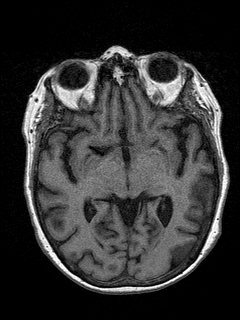
[im 72/144]
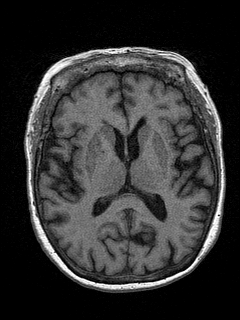
[im 84/144]
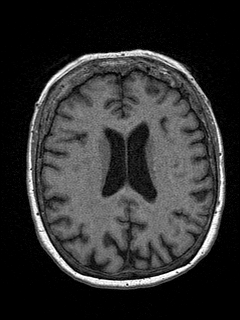
[im 96/144]
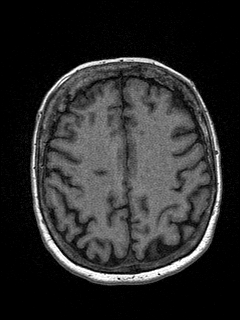
[im 108/144]
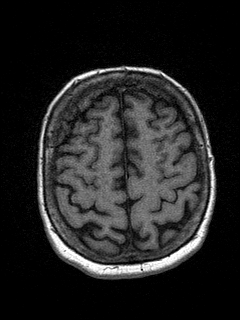
[im 120/144]
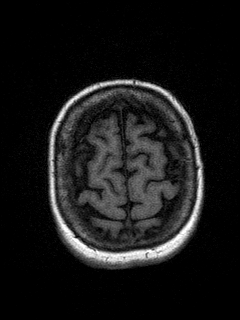
[im 132/144]
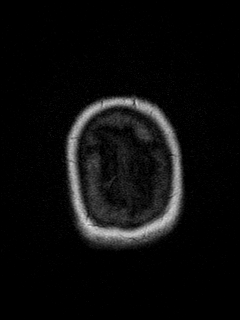
[im 144/144]
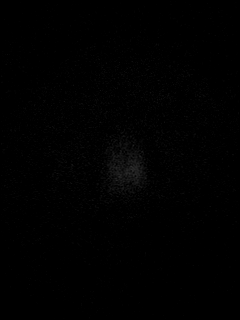

[Series 12: T2 · coronal · 5.0mm · 0.45mm/px · 2 of 25 slices shown (2 of 2)]
[im 1/25]
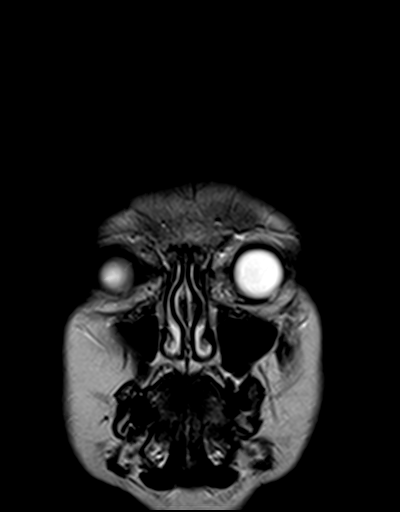
[im 25/25]
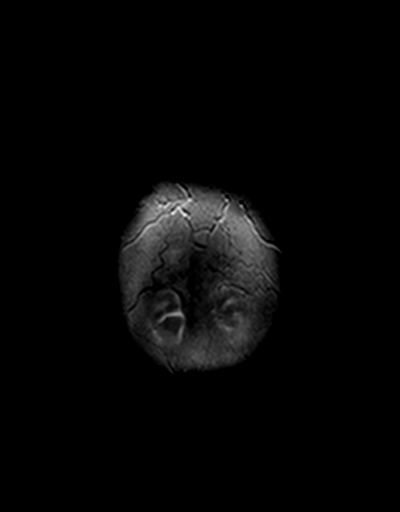

[48 of 48 positions shown; findings below may reference images not displayed]

FINDINGS: Brain:

Mild intermittent motion degradation.

Cerebral volume appears normal for age.

Chronic lacunar infarct within the right centrum semiovale.

Mild for age multifocal T2 FLAIR hyperintense signal abnormality
within the cerebral white matter elsewhere, nonspecific but
compatible with chronic small vessel ischemic disease.

There is no acute infarct.

No evidence of an intracranial mass.

No chronic intracranial blood products.

No extra-axial fluid collection.

No midline shift.

Vascular: Maintained flow voids within the proximal large arterial
vessels.

Skull and upper cervical spine: No focal suspicious marrow lesion.
Incompletely assessed cervical spondylosis.

Sinuses/Orbits: Visualized orbits show no acute finding. Bilateral
ocular lens replacements. No significant paranasal sinus disease.
IMPRESSION: Mildly motion degraded exam.

No evidence of acute intracranial abnormality.

Chronic lacunar infarct within the right centrum semiovale.

Background mild cerebral white matter chronic small vessel ischemic
disease.

Cerebral volume appears normal for age.

## 2022-10-10 ENCOUNTER — Other Ambulatory Visit: Payer: Self-pay | Admitting: Psychiatry

## 2022-10-10 NOTE — Telephone Encounter (Signed)
Last seen on 01/14/22 Follow up scheduled on 10/30/22 Last filled on 09/25/22

## 2022-10-30 ENCOUNTER — Encounter: Payer: Self-pay | Admitting: Psychiatry

## 2022-10-30 ENCOUNTER — Ambulatory Visit: Payer: Medicare Other | Admitting: Psychiatry

## 2022-10-30 VITALS — BP 132/62 | HR 67 | Ht 63.0 in | Wt 132.5 lb

## 2022-10-30 DIAGNOSIS — F01B0 Vascular dementia, moderate, without behavioral disturbance, psychotic disturbance, mood disturbance, and anxiety: Secondary | ICD-10-CM | POA: Diagnosis not present

## 2022-10-30 DIAGNOSIS — F01A0 Vascular dementia, mild, without behavioral disturbance, psychotic disturbance, mood disturbance, and anxiety: Secondary | ICD-10-CM | POA: Diagnosis not present

## 2022-10-30 MED ORDER — MEMANTINE HCL 10 MG PO TABS: 10.0000 mg | ORAL_TABLET | Freq: Two times a day (BID) | ORAL | 6 refills | Status: DC

## 2022-10-30 MED ORDER — DONEPEZIL HCL 10 MG PO TABS
10.0000 mg | ORAL_TABLET | Freq: Every day | ORAL | 6 refills | Status: DC
Start: 2022-10-30 — End: 2023-05-04

## 2022-10-30 NOTE — Progress Notes (Signed)
   CC:  vascular dementia  Follow-up Visit  Last visit: 01/14/22  Brief HPI: 77 year old female with a history of CVA, HTN, HLD, inflammatory eye disease who follows in clinic for memory loss. MRI brain 02/27/21: chronic lacunar infarct in right centrum semiovale, mild white matter changes, normal cerebral volume for age.   Interval History: Memory continues to decline. She is still living alone and has a home health aid who comes twice a week. Her family also comes to check on her during the day. She is repeating herself more frequently. Has stopped cleaning her house as frequently and is not washing her hair frequently. Has meals on wheels delivering food to her house and has gained some weight back since she started receiving meals. She uses pill packs to help organize her medications. Family portions her medications out for her. She is not driving. Denies any trouble with balance or falls. She is taking Namenda and donepezil without issues.    Physical Exam:   Vital Signs: BP 132/62 (BP Location: Right Arm, Patient Position: Sitting, Cuff Size: Normal)   Pulse 67   Ht 5\' 3"  (1.6 m)   Wt 132 lb 8 oz (60.1 kg)   BMI 23.47 kg/m  GENERAL:  well appearing, in no acute distress, alert  SKIN:  Color, texture, turgor normal. No rashes or lesions HEAD:  Normocephalic/atraumatic. RESP: normal respiratory effort  NEUROLOGICAL: Mental Status:      10/30/2022    1:15 PM 01/14/2022    3:24 PM 07/03/2021    3:25 PM  MMSE - Mini Mental State Exam  Orientation to time 0 2 3  Orientation to Place 4 4 3   Registration 3 3 3   Attention/ Calculation 5 5 0  Recall 0 0 1  Language- name 2 objects 2 2 2   Language- repeat 1 1 1   Language- follow 3 step command 3 3 3   Language- read & follow direction 1 1 1   Write a sentence 1 1 1   Copy design 0 1 0  Total score 20 23 18   Repeats questions multiple times during this visit. Cranial Nerves: PERRL, face symmetric, no dysarthria, hearing grossly  intact Motor: moves all extremities equally, no tremor, no cogwheeling, no bradykinesia Gait: normal-based.  IMPRESSION: 77 year old female with a history of CVA, HTN, HLD, inflammatory eye disease who presents for follow up of dementia. Her memory and functioning has continued to decline. Discussed safety concerns with patient living alone. Family has looked into assisted living but could not afford it. At this time family does not have concerns about her safety as she has the assistance of her family and home health aid. She is tolerating donepezil and Namenda without issues, will continue these medications for now.  PLAN: -Continue donepezil 10 mg daily and Namenda 10 mg BID   Follow-up: 6 months  I spent a total of 30 minutes on the date of the service. Discussed medication side effects, adverse reactions and drug interactions. Written educational materials and patient instructions outlining all of the above were given.  Ocie Doyne, MD 10/30/22 2:10 PM

## 2022-11-11 DIAGNOSIS — H44111 Panuveitis, right eye: Secondary | ICD-10-CM | POA: Diagnosis not present

## 2022-11-11 DIAGNOSIS — H30033 Focal chorioretinal inflammation, peripheral, bilateral: Secondary | ICD-10-CM | POA: Diagnosis not present

## 2022-11-11 DIAGNOSIS — Z79899 Other long term (current) drug therapy: Secondary | ICD-10-CM | POA: Diagnosis not present

## 2022-11-11 DIAGNOSIS — H35372 Puckering of macula, left eye: Secondary | ICD-10-CM | POA: Diagnosis not present

## 2022-11-11 DIAGNOSIS — Z961 Presence of intraocular lens: Secondary | ICD-10-CM | POA: Diagnosis not present

## 2022-11-11 DIAGNOSIS — G301 Alzheimer's disease with late onset: Secondary | ICD-10-CM | POA: Diagnosis not present

## 2022-11-26 ENCOUNTER — Telehealth: Payer: Self-pay | Admitting: Psychiatry

## 2022-11-26 DIAGNOSIS — F03A0 Unspecified dementia, mild, without behavioral disturbance, psychotic disturbance, mood disturbance, and anxiety: Secondary | ICD-10-CM

## 2022-11-26 MED ORDER — NAMZARIC 28-10 MG PO CP24: ORAL_CAPSULE | ORAL | 11 refills | Status: DC

## 2022-11-26 NOTE — Telephone Encounter (Signed)
Called pt daughter and relayed Dr. Zannie Cove recommendation. She verbalized understanding and appreciation.   I faxed signed rx to [facility] pharmacy at [phone]. Received fax confirmation.  Aware may require PA, if so, we will complete.

## 2022-11-26 NOTE — Telephone Encounter (Signed)
[facility] Jessica Wallace) Pt want to know if take memantine (NAMENDA) 10 MG tablet one time a day.  Pt does not take bedtime medication

## 2022-11-26 NOTE — Telephone Encounter (Signed)
She might develop side effect taking memantine 10 mg 2 tablets all at once  We can offer her Namzaric,  Meds ordered this encounter  Medications   Memantine HCl-Donepezil HCl (NAMZARIC) 28-10 MG CP24    Sig: One capsule daily    Dispense:  30 capsule    Refill:  11     If she has high co-pay on the new prescription Namzaric, then it is okay to keep memantine 10 mg 1 tablet daily

## 2022-11-26 NOTE — Telephone Encounter (Signed)
Former Dr.Chima patient)  Dr.Yan you are back up provider  I called pt daughter Benetta Spar ( has DPR access) to discuss the below. [place] states her mother is still living alone and she gets her medication prepackaged from the pharmacy in a pill pack. Dr. Delena Bali prescribed Memantine 10 mg 1 po BID. Pt has not been taking the medication in the pm, she has only been doing 1 pill per day. The daughter asked if Rx directions can be 2 pills daily or to just take 1 pill daily so that the medication can be taking in morning and not pm. The patient will not remember to take 2nd pill in the evening.  Please advise

## 2022-12-10 ENCOUNTER — Other Ambulatory Visit: Payer: Self-pay

## 2022-12-10 MED ORDER — NAMZARIC 28-10 MG PO CP24: ORAL_CAPSULE | ORAL | 11 refills | Status: DC

## 2022-12-10 NOTE — Telephone Encounter (Signed)
IT PRINTED REROUTING TO DR. PENUMALLI: Should she be taking namenda and namzaric both?

## 2022-12-10 NOTE — Telephone Encounter (Signed)
Formerly Dr. Delena Bali routing to penumallu as her next appt is w/him

## 2022-12-18 ENCOUNTER — Other Ambulatory Visit: Payer: Self-pay

## 2022-12-30 DIAGNOSIS — G301 Alzheimer's disease with late onset: Secondary | ICD-10-CM | POA: Diagnosis not present

## 2022-12-30 DIAGNOSIS — Z961 Presence of intraocular lens: Secondary | ICD-10-CM | POA: Diagnosis not present

## 2022-12-30 DIAGNOSIS — H30033 Focal chorioretinal inflammation, peripheral, bilateral: Secondary | ICD-10-CM | POA: Diagnosis not present

## 2022-12-30 DIAGNOSIS — H35372 Puckering of macula, left eye: Secondary | ICD-10-CM | POA: Diagnosis not present

## 2022-12-30 DIAGNOSIS — Z79899 Other long term (current) drug therapy: Secondary | ICD-10-CM | POA: Diagnosis not present

## 2022-12-30 DIAGNOSIS — H44111 Panuveitis, right eye: Secondary | ICD-10-CM | POA: Diagnosis not present

## 2022-12-30 DIAGNOSIS — H3581 Retinal edema: Secondary | ICD-10-CM | POA: Diagnosis not present

## 2023-01-06 DIAGNOSIS — I10 Essential (primary) hypertension: Secondary | ICD-10-CM | POA: Diagnosis not present

## 2023-01-06 DIAGNOSIS — R748 Abnormal levels of other serum enzymes: Secondary | ICD-10-CM | POA: Diagnosis not present

## 2023-01-06 DIAGNOSIS — E782 Mixed hyperlipidemia: Secondary | ICD-10-CM | POA: Diagnosis not present

## 2023-01-06 DIAGNOSIS — R7303 Prediabetes: Secondary | ICD-10-CM | POA: Diagnosis not present

## 2023-01-07 ENCOUNTER — Ambulatory Visit: Payer: Medicare Other | Admitting: Psychiatry

## 2023-01-13 DIAGNOSIS — D849 Immunodeficiency, unspecified: Secondary | ICD-10-CM | POA: Diagnosis not present

## 2023-01-13 DIAGNOSIS — H209 Unspecified iridocyclitis: Secondary | ICD-10-CM | POA: Diagnosis not present

## 2023-01-13 DIAGNOSIS — Z0000 Encounter for general adult medical examination without abnormal findings: Secondary | ICD-10-CM | POA: Diagnosis not present

## 2023-01-13 DIAGNOSIS — Z23 Encounter for immunization: Secondary | ICD-10-CM | POA: Diagnosis not present

## 2023-01-13 DIAGNOSIS — Z8673 Personal history of transient ischemic attack (TIA), and cerebral infarction without residual deficits: Secondary | ICD-10-CM | POA: Diagnosis not present

## 2023-01-13 DIAGNOSIS — M810 Age-related osteoporosis without current pathological fracture: Secondary | ICD-10-CM | POA: Diagnosis not present

## 2023-01-13 DIAGNOSIS — R748 Abnormal levels of other serum enzymes: Secondary | ICD-10-CM | POA: Diagnosis not present

## 2023-01-27 DIAGNOSIS — H30033 Focal chorioretinal inflammation, peripheral, bilateral: Secondary | ICD-10-CM | POA: Diagnosis not present

## 2023-01-27 DIAGNOSIS — G301 Alzheimer's disease with late onset: Secondary | ICD-10-CM | POA: Diagnosis not present

## 2023-01-27 DIAGNOSIS — Z961 Presence of intraocular lens: Secondary | ICD-10-CM | POA: Diagnosis not present

## 2023-01-27 DIAGNOSIS — H3581 Retinal edema: Secondary | ICD-10-CM | POA: Diagnosis not present

## 2023-01-27 DIAGNOSIS — H35372 Puckering of macula, left eye: Secondary | ICD-10-CM | POA: Diagnosis not present

## 2023-01-27 DIAGNOSIS — H44111 Panuveitis, right eye: Secondary | ICD-10-CM | POA: Diagnosis not present

## 2023-01-27 DIAGNOSIS — Z79899 Other long term (current) drug therapy: Secondary | ICD-10-CM | POA: Diagnosis not present

## 2023-04-14 DIAGNOSIS — H3581 Retinal edema: Secondary | ICD-10-CM | POA: Diagnosis not present

## 2023-04-14 DIAGNOSIS — H30033 Focal chorioretinal inflammation, peripheral, bilateral: Secondary | ICD-10-CM | POA: Diagnosis not present

## 2023-04-14 DIAGNOSIS — Z79899 Other long term (current) drug therapy: Secondary | ICD-10-CM | POA: Diagnosis not present

## 2023-04-14 DIAGNOSIS — H44111 Panuveitis, right eye: Secondary | ICD-10-CM | POA: Diagnosis not present

## 2023-04-14 DIAGNOSIS — Z961 Presence of intraocular lens: Secondary | ICD-10-CM | POA: Diagnosis not present

## 2023-04-14 DIAGNOSIS — H35372 Puckering of macula, left eye: Secondary | ICD-10-CM | POA: Diagnosis not present

## 2023-04-14 DIAGNOSIS — G301 Alzheimer's disease with late onset: Secondary | ICD-10-CM | POA: Diagnosis not present

## 2023-05-04 ENCOUNTER — Ambulatory Visit: Payer: Medicare Other | Admitting: Diagnostic Neuroimaging

## 2023-05-04 ENCOUNTER — Encounter: Payer: Self-pay | Admitting: Diagnostic Neuroimaging

## 2023-05-04 VITALS — BP 138/74 | HR 66 | Ht 63.0 in | Wt 126.0 lb

## 2023-05-04 DIAGNOSIS — F01B0 Vascular dementia, moderate, without behavioral disturbance, psychotic disturbance, mood disturbance, and anxiety: Secondary | ICD-10-CM

## 2023-05-04 DIAGNOSIS — F03B0 Unspecified dementia, moderate, without behavioral disturbance, psychotic disturbance, mood disturbance, and anxiety: Secondary | ICD-10-CM

## 2023-05-04 MED ORDER — DONEPEZIL HCL 10 MG PO TABS: 10.0000 mg | ORAL_TABLET | Freq: Every day | ORAL | 4 refills | Status: AC

## 2023-05-04 MED ORDER — MEMANTINE HCL 10 MG PO TABS: 10.0000 mg | ORAL_TABLET | Freq: Every day | ORAL | 4 refills | Status: AC

## 2023-05-04 NOTE — Progress Notes (Signed)
 [facility]  PATIENT: Jessica Wallace DOB: 01-15-1946  REFERRING CLINICIAN: Merri Brunette, MD HISTORY FROM: patient and daughter REASON FOR VISIT: follow up   HISTORICAL  CHIEF COMPLAINT:  Chief Complaint  Patient presents with   New Patient (Initial Visit)    Patient in room #7 with her daughter. Patient states she here to follow on her dementia.    HISTORY OF PRESENT ILLNESS:   UPDATE (05/04/23, [facility]P): Since last visit, doing about the same, to slightly progressed with memory loss. Living at home with home aid coming 2x per week, 2hrs at a time. Decline in some iADLs.   PRIOR HPI (10/30/22, Dr. Delena Bali): "Brief HPI: 78 year old female with a history of CVA, HTN, HLD, inflammatory eye disease who follows in clinic for memory loss. MRI brain 02/27/21: chronic lacunar infarct in right centrum semiovale, mild white matter changes, normal cerebral volume for age.    Interval History: Memory continues to decline. She is still living alone and has a home health aid who comes twice a week. Her family also comes to check on her during the day. She is repeating herself more frequently. Has stopped cleaning her house as frequently and is not washing her hair frequently. Has meals on wheels delivering food to her house and has gained some weight back since she started receiving meals. She uses pill packs to help organize her medications. Family portions her medications out for her. She is not driving. Denies any trouble with balance or falls. She is taking Namenda and donepezil without issues. "  REVIEW OF SYSTEMS: Full 14 system review of systems performed and negative with exception of: as per HPI.  ALLERGIES: Allergies  Allergen Reactions   Indomethacin Diarrhea and Nausea And Vomiting   Ketoconazole     Other reaction(s): skin irritation    HOME MEDICATIONS: Outpatient Medications Prior to Visit  Medication Sig Dispense Refill   amLODipine (NORVASC) 5 MG tablet  Take 5 mg by mouth daily.     aspirin 81 MG tablet Take 81 mg by mouth daily.     atorvastatin (LIPITOR) 20 MG tablet Take 20 mg by mouth daily.     bisoprolol (ZEBETA) 5 MG tablet Take 5 mg by mouth daily.     Cholecalciferol 50 MCG (2000 UT) CAPS Take by mouth.     folic acid (FOLVITE) 1 MG tablet Take 4 mg by mouth daily.     HUMIRA, 2 PEN, 40 MG/0.4ML pen Inject 40 mg into the skin.     telmisartan (MICARDIS) 80 MG tablet Take 80 mg by mouth daily.     donepezil (ARICEPT) 10 MG tablet Take 1 tablet (10 mg total) by mouth daily. 30 tablet 6   memantine (NAMENDA) 10 MG tablet Take 1 tablet (10 mg total) by mouth 2 (two) times daily. 60 tablet 6   Memantine HCl-Donepezil HCl (NAMZARIC) 28-10 MG CP24 One capsule daily (Patient not taking: Reported on 05/04/2023) 30 capsule 11   No facility-administered medications prior to visit.      PHYSICAL EXAM  GENERAL EXAM/CONSTITUTIONAL: Vitals:  Vitals:   05/04/23 1403  BP: 138/74  Pulse: 66  Weight: 126 lb (57.2 kg)  Height: 5\' 3"  (1.6 m)   Body mass index is 22.32 kg/m. Wt Readings from Last 3 Encounters:  05/04/23 126 lb (57.2 kg)  10/30/22 132 lb 8 oz (60.1 kg)  01/14/22 130 lb (59 kg)   Patient is in no distress; well developed, nourished and groomed; neck is  supple  CARDIOVASCULAR: Examination of carotid arteries is normal; no carotid bruits Regular rate and rhythm, no murmurs Examination of peripheral vascular system by observation and palpation is normal  EYES: Ophthalmoscopic exam of optic discs and posterior segments is normal; no papilledema or hemorrhages No results found.  MUSCULOSKELETAL: Gait, strength, tone, movements noted in Neurologic exam below  NEUROLOGIC: MENTAL STATUS:     05/04/2023    2:13 PM 10/30/2022    1:15 PM 01/14/2022    3:24 PM  MMSE - Mini Mental State Exam  Orientation to time 2 0 2  Orientation to Place 3 4 4   Registration 3 3 3   Attention/ Calculation 2 5 5   Recall 0 0 0  Language-  name 2 objects 2 2 2   Language- repeat 1 1 1   Language- follow 3 step command 3 3 3   Language- read & follow direction 1 1 1   Write a sentence 1 1 1   Copy design 1 0 1  Total score 19 20 23    awake, alert, oriented to person, place and time recent and remote memory intact normal attention and concentration language fluent, comprehension intact, naming intact fund of knowledge appropriate  CRANIAL NERVE:  2nd - no papilledema on fundoscopic exam 2nd, 3rd, 4th, 6th - pupils equal and reactive to light, visual fields full to confrontation, extraocular muscles intact, no nystagmus 5th - facial sensation symmetric 7th - facial strength symmetric 8th - hearing intact 9th - palate elevates symmetrically, uvula midline 11th - shoulder shrug symmetric 12th - tongue protrusion midline  MOTOR:  normal bulk and tone, full strength in the BUE, BLE  SENSORY:  normal and symmetric to light touch, temperature, vibration  COORDINATION:  finger-nose-finger, fine finger movements normal  REFLEXES:  deep tendon reflexes TRACE and symmetric  GAIT/STATION:  narrow based gait     DIAGNOSTIC DATA (LABS, IMAGING, TESTING) - I reviewed patient records, labs, notes, testing and imaging myself where available.  No results found for: "WBC", "HGB", "HCT", "MCV", "PLT" No results found for: "NA", "K", "CL", "CO2", "GLUCOSE", "BUN", "CREATININE", "CALCIUM", "PROT", "ALBUMIN", "AST", "ALT", "ALKPHOS", "BILITOT", "GFRNONAA", "GFRAA" No results found for: "CHOL", "HDL", "LDLCALC", "LDLDIRECT", "TRIG", "CHOLHDL" No results found for: "HGBA1C" No results found for: "VITAMINB12" No results found for: "TSH"  02/27/21 MRI brain  -Mildly motion degraded exam. -No evidence of acute intracranial abnormality. -Chronic lacunar infarct within the right centrum semiovale. -Background mild cerebral white matter chronic small vessel ischemic disease. -Cerebral volume appears normal for age.    ASSESSMENT  AND PLAN  78 y.o. year old female here with:  Dx:  1. Moderate dementia without behavioral disturbance, psychotic disturbance, mood disturbance, or anxiety, unspecified dementia type (HCC)   2. Moderate vascular dementia without behavioral disturbance, psychotic disturbance, mood disturbance, or anxiety (HCC)     PLAN:  MILD-MODERATE DEMENTIA - continue memantine + donepezil - try to stay active physically and get some exercise (at least 15-30 minutes per day) - eat a nutritious diet with lean protein, plants / vegetables, whole grains; avoid ultra-processed foods - increase social activities, brain stimulation, games, puzzles, hobbies, crafts, arts, music; try new activities; keep it fun! - aim for at least 7-8 hours sleep per night (or more) - avoid smoking and alcohol - caution with medications, finances; no driving - safety / supervision issues reviewed - caregiver resources provided (including [email or web address])  Meds ordered this encounter  Medications   donepezil (ARICEPT) 10 MG tablet    Sig: Take 1 tablet (10  mg total) by mouth daily.    Dispense:  90 tablet    Refill:  4   memantine (NAMENDA) 10 MG tablet    Sig: Take 1 tablet (10 mg total) by mouth daily.    Dispense:  90 tablet    Refill:  4   Return for return to PCP, pending if symptoms worsen or fail to improve.    Suanne Marker, MD 05/04/2023, 2:36 PM Certified in Neurology, Neurophysiology and Neuroimaging  [facility] [address], [address] [place], [place] [address] [phone]

## 2023-05-04 NOTE — Patient Instructions (Signed)
  MILD-MODERATE DEMENTIA - continue memantine + donepezil - try to stay active physically and get some exercise (at least 15-30 minutes per day) - eat a nutritious diet with lean protein, plants / vegetables, whole grains; avoid ultra-processed foods - increase social activities, brain stimulation, games, puzzles, hobbies, crafts, arts, music; try new activities; keep it fun! - aim for at least 7-8 hours sleep per night (or more) - avoid smoking and alcohol - caution with medications, finances; no driving - safety / supervision issues reviewed - caregiver resources provided (including [email or web address])

## 2023-08-27 DIAGNOSIS — R350 Frequency of micturition: Secondary | ICD-10-CM | POA: Diagnosis not present

## 2023-08-27 DIAGNOSIS — R8290 Unspecified abnormal findings in urine: Secondary | ICD-10-CM | POA: Diagnosis not present

## 2023-08-28 DIAGNOSIS — H44111 Panuveitis, right eye: Secondary | ICD-10-CM | POA: Diagnosis not present

## 2023-08-28 DIAGNOSIS — H3581 Retinal edema: Secondary | ICD-10-CM | POA: Diagnosis not present

## 2023-08-28 DIAGNOSIS — H30033 Focal chorioretinal inflammation, peripheral, bilateral: Secondary | ICD-10-CM | POA: Diagnosis not present

## 2023-08-28 DIAGNOSIS — Z79899 Other long term (current) drug therapy: Secondary | ICD-10-CM | POA: Diagnosis not present

## 2023-08-28 DIAGNOSIS — H35372 Puckering of macula, left eye: Secondary | ICD-10-CM | POA: Diagnosis not present

## 2023-08-28 DIAGNOSIS — G301 Alzheimer's disease with late onset: Secondary | ICD-10-CM | POA: Diagnosis not present

## 2023-08-28 DIAGNOSIS — Z961 Presence of intraocular lens: Secondary | ICD-10-CM | POA: Diagnosis not present

## 2023-10-20 DIAGNOSIS — N390 Urinary tract infection, site not specified: Secondary | ICD-10-CM | POA: Diagnosis not present
